# Patient Record
Sex: Male | Born: 1950 | Race: Black or African American | Hispanic: No | Marital: Single | Smoking: Never smoker
Health system: Southern US, Community
[De-identification: ages and names within clinical notes are randomized; demographics above are authoritative.]

## PROBLEM LIST (undated history)

## (undated) DIAGNOSIS — E7800 Pure hypercholesterolemia, unspecified: Secondary | ICD-10-CM

## (undated) DIAGNOSIS — G4730 Sleep apnea, unspecified: Secondary | ICD-10-CM

## (undated) DIAGNOSIS — E119 Type 2 diabetes mellitus without complications: Secondary | ICD-10-CM

## (undated) DIAGNOSIS — N400 Enlarged prostate without lower urinary tract symptoms: Secondary | ICD-10-CM

## (undated) HISTORY — PX: PROSTATECTOMY: SHX69

## (undated) HISTORY — PX: KIDNEY STONE SURGERY: SHX686

---

## 2001-04-04 ENCOUNTER — Encounter: Payer: Self-pay | Admitting: Orthopedic Surgery

## 2001-04-04 ENCOUNTER — Ambulatory Visit (HOSPITAL_COMMUNITY): Admission: RE | Admit: 2001-04-04 | Discharge: 2001-04-04 | Payer: Self-pay | Admitting: Orthopedic Surgery

## 2001-04-12 ENCOUNTER — Encounter (HOSPITAL_COMMUNITY): Admission: RE | Admit: 2001-04-12 | Discharge: 2001-05-12 | Payer: Self-pay | Admitting: Orthopedic Surgery

## 2001-04-25 ENCOUNTER — Ambulatory Visit (HOSPITAL_COMMUNITY): Admission: RE | Admit: 2001-04-25 | Discharge: 2001-04-25 | Payer: Self-pay | Admitting: Neurosurgery

## 2001-05-12 ENCOUNTER — Encounter (HOSPITAL_COMMUNITY): Admission: RE | Admit: 2001-05-12 | Discharge: 2001-06-11 | Payer: Self-pay | Admitting: Orthopedic Surgery

## 2002-11-22 ENCOUNTER — Emergency Department (HOSPITAL_COMMUNITY): Admission: EM | Admit: 2002-11-22 | Discharge: 2002-11-23 | Payer: Self-pay | Admitting: *Deleted

## 2003-03-26 ENCOUNTER — Emergency Department (HOSPITAL_COMMUNITY): Admission: EM | Admit: 2003-03-26 | Discharge: 2003-03-26 | Payer: Self-pay | Admitting: Emergency Medicine

## 2003-03-31 ENCOUNTER — Emergency Department (HOSPITAL_COMMUNITY): Admission: EM | Admit: 2003-03-31 | Discharge: 2003-03-31 | Payer: Self-pay | Admitting: Emergency Medicine

## 2004-07-16 ENCOUNTER — Emergency Department (HOSPITAL_COMMUNITY): Admission: EM | Admit: 2004-07-16 | Discharge: 2004-07-16 | Payer: Self-pay | Admitting: Emergency Medicine

## 2021-05-18 ENCOUNTER — Encounter (INDEPENDENT_AMBULATORY_CARE_PROVIDER_SITE_OTHER): Payer: Self-pay | Admitting: *Deleted

## 2021-06-08 NOTE — Progress Notes (Signed)
Nicholas Lawrence, [place] [address]   CLINIC:  Medical Oncology/Hematology  CONSULT NOTE  Patient Care Team: [facility] [place] as PCP - General Derek Jack, MD as Medical Oncologist (Medical Oncology) Brien Mates, RN as Oncology Nurse Navigator (Oncology)  CHIEF COMPLAINTS/PURPOSE OF CONSULTATION:  Evaluation of mantle cell lymphoma  HISTORY OF PRESENTING ILLNESS:  Nicholas Lawrence 70 y.o. male is here because of evaluation of mantle cell lymphoma.  Today he reports feeling good. He has a history of elevated WBC count starting in 2010. He denies night sweats, fevers, and unintentional weight loss although he reports intentionally losing about 10 lbs over the past 6 months. His appetite is good. He denies tingling/numbness in his hands and feet, cough, and fatigue. He denies history of MI and CVA.   He currently lives at home by himself, and he is able to do all of his typical home activities without assistance. His daughter is his closest relative and lives in [place]. 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 His brother was a smoker and had lung cancer, and his paternal uncle was also a smoker and had throat cancer.   MEDICAL HISTORY:  No past medical history on file.  SURGICAL HISTORY: Several prostate biopsies for elevated PSA, all negative for malignancy.  Simple prostatectomy 2006.  Percutaneous lithotripsy 1995.  SOCIAL HISTORY: Social History   Socioeconomic History   Marital status: Single    Spouse name: Not on file   Number of children: Not on file   Years of education: Not on file   Highest education level: Not on file  Occupational History   Not on file  Tobacco Use   Smoking status: Not on file   Smokeless tobacco: Not on file  Substance and Sexual Activity    Alcohol use: Not on file   Drug use: Not on file   Sexual activity: Not on file  Other Topics Concern   Not on file  Social History Narrative   Not on file   Social Determinants of Health   Financial Resource Strain: Not on file  Food Insecurity: Not on file  Transportation Needs: Not on file  Physical Activity: Not on file  Stress: Not on file  Social Connections: Not on file  Intimate Partner Violence: Not on file    FAMILY HISTORY: No family history on file.  ALLERGIES:  has no allergies on file.  MEDICATIONS:  Current Outpatient Medications  Medication Sig Dispense Refill   aspirin 81 MG EC tablet Take 1 tablet by mouth daily.     Cholecalciferol (VITAMIN D3) 1.25 MG (50000 UT) TABS Take 125 mcg by mouth daily at 2 am.     finasteride (PROSCAR) 5 MG tablet Take 5 mg by mouth daily.     glipiZIDE (GLUCOTROL) 5 MG tablet Take by mouth daily before breakfast.     lisinopril (ZESTRIL) 40 MG tablet Take 40 mg by mouth daily.     loratadine (CLARITIN) 10 MG tablet Take 10 mg by mouth daily.     sertraline (ZOLOFT) 100 MG tablet Take by mouth daily.     simvastatin (ZOCOR) 10 MG tablet Take 1 tablet by mouth at bedtime.     tamsulosin (FLOMAX) 0.4 MG CAPS capsule Take 0.4 mg by mouth daily.     No  current facility-administered medications for this visit.    REVIEW OF SYSTEMS:   Review of Systems  Constitutional:  Negative for appetite change, fatigue (75%), fever and unexpected weight change.  Respiratory:  Negative for cough.   Neurological:  Negative for numbness.  All other systems reviewed and are negative.   PHYSICAL EXAMINATION: ECOG PERFORMANCE STATUS: 1 - Symptomatic but completely ambulatory  Vitals:   06/09/21 0755  BP: 122/68  Pulse: 97  Resp: 19  Temp: (!) 96.9 F (36.1 C)  SpO2: 94%   Filed Weights   06/09/21 0755  Weight: 163 lb 4.8 oz (74.1 kg)   Physical Exam Vitals reviewed.  Constitutional:      Appearance: Normal appearance.   Cardiovascular:     Rate and Rhythm: Normal rate and regular rhythm.     Pulses: Normal pulses.     Heart sounds: Normal heart sounds.  Pulmonary:     Effort: Pulmonary effort is normal.     Breath sounds: Normal breath sounds.  Chest:     Chest wall: Deformity (Prominent lower part of sternum) present.  Abdominal:     Palpations: Abdomen is soft. There is splenomegaly (6 fingerbreadths below left costal margin). There is no hepatomegaly or mass.     Tenderness: There is no abdominal tenderness.  Musculoskeletal:     Right lower leg: No edema.     Left lower leg: No edema.  Lymphadenopathy:     Cervical: No cervical adenopathy.     Right cervical: No superficial cervical adenopathy.    Left cervical: No superficial cervical adenopathy.     Upper Body:     Right upper body: Axillary adenopathy (sub cm) present. No supraclavicular adenopathy.     Left upper body: Axillary adenopathy (sub cm) present. No supraclavicular adenopathy.  Neurological:     General: No focal deficit present.     Mental Status: He is alert and oriented to person, place, and time.  Psychiatric:        Mood and Affect: Mood normal.        Behavior: Behavior normal.     LABORATORY DATA:  I have reviewed the data as listed CBC Latest Ref Rng & Units 06/09/2021  WBC 4.0 - 10.5 K/uL 61.6(HH)  Hemoglobin 13.0 - 17.0 g/dL 10.8(L)  Hematocrit 39.0 - 52.0 % 35.9(L)  Platelets 150 - 400 K/uL 105(L)   CMP Latest Ref Rng & Units 06/09/2021  Glucose 70 - 99 mg/dL 117(H)  BUN 8 - 23 mg/dL 20  Creatinine 0.61 - 1.24 mg/dL 1.48(H)  Sodium 135 - 145 mmol/L 135  Potassium 3.5 - 5.1 mmol/L 4.3  Chloride 98 - 111 mmol/L 103  CO2 22 - 32 mmol/L 25  Calcium 8.9 - 10.3 mg/dL 9.3  Total Protein 6.5 - 8.1 g/dL 9.4(H)  Total Bilirubin 0.3 - 1.2 mg/dL 0.4  Alkaline Phos 38 - 126 U/L 80  AST 15 - 41 U/L 15  ALT 0 - 44 U/L 19    RADIOGRAPHIC STUDIES: I have personally reviewed the radiological images as listed and  agreed with the findings in the report. No results found.  ASSESSMENT:  Mantle cell lymphoma, stage IIIa: - 2010: Lymphocytosis - August 2019: Flow cytometry-clonal CD20 positive B cells that coexpress CD5 and surface kappa light chain.  Negative for CD10 and CD23 comprising 78% lymphocytes. - PET scan 08/12/2246: Hypermetabolic adenopathy in the right neck, axilla, mediastinum, porta hepatis and pelvis. - PET scan on 08/17/35: Hypermetabolic right level 2 and  level 3 adenopathy, SUV 6.5.  Hypermetabolic bilateral axillary adenopathy SUV 4.3.  Mediastinal lymph node SUV 5.0.  Ill-defined lesion of the right upper lobe SUV 1.3.  Porta hepatic lymph node SUV 7.4.  Enlarged spleen with hypermetabolic activity.  Pelvic lymph nodes SUV 7.0.  Increased uptake in the posterior right prostate SUV 6.4. - Right axillary lymph node needle biopsy on 01/19/2021 consistent with mantle cell lymphoma.  SOX11 variable expression. - He was evaluated by Dr. Corine Shelter at [facility] and was recommended chemoimmunotherapy as his hemoglobin and platelets were trending down. - He had voluntary weight loss of 10 pounds in the last 6 months.  Denies any fevers or night sweats.   Social/family history: - He lives by himself at home.  His daughter lives in [place]. - He served in the [employer] and participated in the operation [employer] in the [place].  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  He is a non-smoker. - Brother died of lung cancer and paternal uncle had throat cancer.   PLAN:  Stage IIIa mantle cell lymphoma: - We have reviewed previous PET scan and biopsy findings with the patient in detail.  We discussed the natural history of mantle cell lymphoma. - Peripheral blood cyclin D1 was negative.  Would obtain complete pathology report from [facility] to see if [place] for t(11;14) was done. - We will consider rebiopsy if previous studies inadequate. - We will order T p53 mutation testing which usually  portends a poor prognosis. - I will order another PET CT scan.  This is to serve as a baseline as well as rule out transformation prior to start of treatment for lymphoma. - We will repeat his CBC, LDH, CMP.  We will also do anemia panel.  We will also check tumor lysis labs. - He will need port placement. - If there is no transformation, agree with Bendamustine and rituximab based regimen which is more tolerable and has good response rates.   All questions were answered. The patient knows to call the clinic with any problems, questions or concerns.   Derek Jack, MD, 06/09/21 6:33 PM  [place] [phone]   I, Thana Ates, am acting as a scribe for Dr. Derek Jack.  I, Derek Jack MD, have reviewed the above documentation for accuracy and completeness, and I agree with the above.

## 2021-06-09 ENCOUNTER — Other Ambulatory Visit: Payer: Self-pay

## 2021-06-09 ENCOUNTER — Encounter (HOSPITAL_COMMUNITY): Payer: Self-pay

## 2021-06-09 ENCOUNTER — Inpatient Hospital Stay (HOSPITAL_COMMUNITY): Payer: No Typology Code available for payment source

## 2021-06-09 ENCOUNTER — Inpatient Hospital Stay (HOSPITAL_COMMUNITY): Payer: No Typology Code available for payment source | Attending: Hematology | Admitting: Hematology

## 2021-06-09 DIAGNOSIS — Z808 Family history of malignant neoplasm of other organs or systems: Secondary | ICD-10-CM | POA: Diagnosis not present

## 2021-06-09 DIAGNOSIS — C8318 Mantle cell lymphoma, lymph nodes of multiple sites: Secondary | ICD-10-CM

## 2021-06-09 DIAGNOSIS — Z87891 Personal history of nicotine dependence: Secondary | ICD-10-CM | POA: Diagnosis not present

## 2021-06-09 DIAGNOSIS — C8310 Mantle cell lymphoma, unspecified site: Secondary | ICD-10-CM | POA: Insufficient documentation

## 2021-06-09 DIAGNOSIS — Z801 Family history of malignant neoplasm of trachea, bronchus and lung: Secondary | ICD-10-CM | POA: Diagnosis not present

## 2021-06-09 LAB — IRON AND TIBC
Iron: 37 ug/dL — ABNORMAL LOW (ref 45–182)
Saturation Ratios: 13 % — ABNORMAL LOW (ref 17.9–39.5)
TIBC: 292 ug/dL (ref 250–450)
UIBC: 255 ug/dL

## 2021-06-09 LAB — HEPATITIS C ANTIBODY: HCV Ab: NONREACTIVE

## 2021-06-09 LAB — CBC WITH DIFFERENTIAL/PLATELET
Basophils Absolute: 0.6 10*3/uL — ABNORMAL HIGH (ref 0.0–0.1)
Basophils Relative: 1 %
Eosinophils Absolute: 2.5 10*3/uL — ABNORMAL HIGH (ref 0.0–0.5)
Eosinophils Relative: 4 %
HCT: 35.9 % — ABNORMAL LOW (ref 39.0–52.0)
Hemoglobin: 10.8 g/dL — ABNORMAL LOW (ref 13.0–17.0)
Lymphocytes Relative: 90 %
Lymphs Abs: 55.4 10*3/uL — ABNORMAL HIGH (ref 0.7–4.0)
MCH: 23.8 pg — ABNORMAL LOW (ref 26.0–34.0)
MCHC: 30.1 g/dL (ref 30.0–36.0)
MCV: 79.2 fL — ABNORMAL LOW (ref 80.0–100.0)
Monocytes Absolute: 0 10*3/uL — ABNORMAL LOW (ref 0.1–1.0)
Monocytes Relative: 0 %
Neutro Abs: 3.1 10*3/uL (ref 1.7–7.7)
Neutrophils Relative %: 5 %
Platelets: 105 10*3/uL — ABNORMAL LOW (ref 150–400)
RBC: 4.53 MIL/uL (ref 4.22–5.81)
RDW: 18.4 % — ABNORMAL HIGH (ref 11.5–15.5)
WBC Morphology: ABNORMAL
WBC: 61.6 10*3/uL (ref 4.0–10.5)
nRBC: 0 % (ref 0.0–0.2)

## 2021-06-09 LAB — HEPATITIS B SURFACE ANTIGEN: Hepatitis B Surface Ag: NONREACTIVE

## 2021-06-09 LAB — COMPREHENSIVE METABOLIC PANEL
ALT: 19 U/L (ref 0–44)
AST: 15 U/L (ref 15–41)
Albumin: 3.7 g/dL (ref 3.5–5.0)
Alkaline Phosphatase: 80 U/L (ref 38–126)
Anion gap: 7 (ref 5–15)
BUN: 20 mg/dL (ref 8–23)
CO2: 25 mmol/L (ref 22–32)
Calcium: 9.3 mg/dL (ref 8.9–10.3)
Chloride: 103 mmol/L (ref 98–111)
Creatinine, Ser: 1.48 mg/dL — ABNORMAL HIGH (ref 0.61–1.24)
GFR, Estimated: 50 mL/min — ABNORMAL LOW (ref >60)
Glucose, Bld: 117 mg/dL — ABNORMAL HIGH (ref 70–99)
Potassium: 4.3 mmol/L (ref 3.5–5.1)
Sodium: 135 mmol/L (ref 135–145)
Total Bilirubin: 0.4 mg/dL (ref 0.3–1.2)
Total Protein: 9.4 g/dL — ABNORMAL HIGH (ref 6.5–8.1)

## 2021-06-09 LAB — FERRITIN: Ferritin: 86 ng/mL (ref 24–336)

## 2021-06-09 LAB — LACTATE DEHYDROGENASE: LDH: 135 U/L (ref 98–192)

## 2021-06-09 LAB — HEPATITIS B SURFACE ANTIBODY,QUALITATIVE: Hep B S Ab: NONREACTIVE

## 2021-06-09 LAB — URIC ACID: Uric Acid, Serum: 8.5 mg/dL (ref 3.7–8.6)

## 2021-06-09 LAB — VITAMIN B12: Vitamin B-12: 345 pg/mL (ref 180–914)

## 2021-06-09 LAB — FOLATE: Folate: 8.9 ng/mL (ref >5.9)

## 2021-06-09 LAB — HEPATITIS B CORE ANTIBODY, TOTAL: Hep B Core Total Ab: NONREACTIVE

## 2021-06-09 NOTE — Progress Notes (Unsigned)
CRITICAL VALUE ALERT Critical value received:  WBC 61.6 Date of notification:  06/09/21 Time of notification: 0721 Critical value read back:  Yes.   Nurse who received alert:  C.Draken Farrior RN MD notified time and response:  1045 sent to MD who is seeing pt today

## 2021-06-09 NOTE — Progress Notes (Signed)
I met with the patient today during and following visit with Dr. Delton Coombes. I introduced myself and explained my role in the patient's care. I provided the patient with my contact information and encourage him and his family to call with questions or concerns. Patient provided with written information on Rituxan and Bendamustine as discussed by Dr. Delton Coombes. Patient scheduled for chemotherapy education.

## 2021-06-09 NOTE — Patient Instructions (Addendum)
[place] at [facility] Discharge Instructions  You were seen and examined today by Dr. Delton Coombes. Dr. Delton Coombes is a medical oncologist, meaning that he specializes in cancer diagnoses. Dr. Delton Coombes discussed your past medical history, family history of cancer, and the events that led to you being here today.  You have been diagnosed with Mantle Cell Lymphoma. This is a type of slow-growing lymphoma, it cannot be cured but it can be controlled. We know that it is slow growing because it has been present for years without complications, but now that your platelets and red blood cells are dropping, treatment is recommended. Dr. Delton Coombes discussed a combination of chemotherapy and immunotherapy.  Dr. Delton Coombes has recommended additional lab work today as well as a PET scan to establish baseline prior to starting treatment. Dr. Delton Coombes has also recommended a Port-A-Cath placement. A Port-A-Cath (port) is the safest way to administer chemotherapy.  Follow-up as scheduled.   Thank you for choosing [place] at [facility] to provide your oncology and hematology care.  To afford each patient quality time with our provider, please arrive at least 15 minutes before your scheduled appointment time.   If you have a lab appointment with the [facility] please come in thru the Main Entrance and check in at the main information desk.  You need to re-schedule your appointment should you arrive 10 or more minutes late.  We strive to give you quality time with our providers, and arriving late affects you and other patients whose appointments are after yours.  Also, if you no show three or more times for appointments you may be dismissed from the clinic at the providers discretion.     Again, thank you for choosing [facility].  Our hope is that these requests will decrease the amount of time that you wait before being seen by our physicians.        _____________________________________________________________  Should you have questions after your visit to [facility], please contact our office at [phone] and follow the prompts.  Our office hours are 8:00 a.m. and 4:30 p.m. Monday - Friday.  Please note that voicemails left after 4:00 p.m. may not be returned until the following business day.  We are closed weekends and major holidays.  You do have access to a nurse 24-7, just call the main number to the clinic [phone] and do not press any options, hold on the line and a nurse will answer the phone.    For prescription refill requests, have your pharmacy contact our office and allow 72 hours.    Due to Covid, you will need to wear a mask upon entering the hospital. If you do not have a mask, a mask will be given to you at the Main Entrance upon arrival. For doctor visits, patients may have 1 support person age 62 or older with them. For treatment visits, patients can not have anyone with them due to social distancing guidelines and our immunocompromised population.

## 2021-06-10 LAB — COPPER, SERUM: Copper: 196 ug/dL — ABNORMAL HIGH (ref 69–132)

## 2021-06-10 LAB — BETA 2 MICROGLOBULIN, SERUM: Beta-2 Microglobulin: 4.4 mg/L — ABNORMAL HIGH (ref 0.6–2.4)

## 2021-06-11 ENCOUNTER — Encounter (HOSPITAL_COMMUNITY): Admission: RE | Admit: 2021-06-11 | Payer: No Typology Code available for payment source | Source: Ambulatory Visit

## 2021-06-11 LAB — PATHOLOGIST SMEAR REVIEW

## 2021-06-13 NOTE — Patient Instructions (Addendum)
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  You will be treated in the clinic every 3 weeks with a combination of chemotherapy and immunotherapy drugs.  Those drugs are Rituxan (immunotherapy) and bendamustine (chemo).  You will come 2 days in a row every 3 weeks to receive treatment.  The first day you will receive both drugs.  On Day 2, you will receive only bendamustine. The intent of treatment is to control this cancer, prevent it from spreading further, and to alleviate any symptoms you may be having related to this disease.  You will see the doctor regularly throughout treatment.  We will obtain blood work from you prior to every treatment and monitor your results to make sure it is safe to give your treatment. The doctor monitors your response to treatment by the way you are feeling, your blood work, and by obtaining scans periodically.  There will be wait times while you are here for treatment.  It will take about 30 minutes to 1 hour for your lab work to result.  Then there will be wait times while pharmacy mixes your medications.     Medications you will receive in the clinic prior to your chemotherapy medications:  Aloxi:  ALOXI is used in adults to help prevent nausea and vomiting that happens with certain chemotherapy drugs.  Aloxi is a long acting medication, and will remain in your system for about two days.   Dexamethasone:  This is a steroid given prior to chemotherapy to help prevent allergic reactions; it may also help prevent and control nausea and diarrhea.   Tylenol:  Given to prevent infusion reactions to rituximab.  Benadryl:  Antihistamine given to prevent allergic/infusion reactions to rituximab.   Rasburicase:  Rasburicase is used to prevent and treat high uric acid levels due to cancer and cancer treatment. It is given in the vein (IV). It takes 30 minutes to infuse.       Rituximab (Generic Name) Other  Name: Rituxan  About This Drug  Rituximab is a monoclonal antibody used to treat cancer. This drug is given in the vein (IV).  This first time this is given it will be infused slower to monitor for infusion reactions. If you tolerate the first infusion well, going forward we give it at the normal rate, which takes half as long to infuse.    Possible Side Effects (More Common)   Bone marrow depression. This is a decrease in the number of white blood cells, red blood cells, and platelets. This  may raise your risk of infection, make you tired and weak (fatigue), and raise your risk of bleeding.   Rash-skin irritation, redness or itching (dermatitis)   Flu-like symptoms: fever, headache, muscle and joint aches, and fatigue (low energy, feeling weak)   Infusion-related reactions   Hepatitis B - if you have ever had hepatitis B, the virus may come back during treatment with this drug. Your doctor will test to see if you have ever had hepatitis B prior to your treatment.   Changes in your central nervous system can happen. The central nervous system is made up of your brain and spinal cord. You could feel: extreme tiredness, agitation, confusion, or have: hallucinations (see or hear things that are not there), trouble understanding or speaking, loss of control of your bowels or bladder, eyesight changes, numbness or lack of strength to your arms, legs, face, or body, seizures or coma. If you  start to have any of these symptoms let your doctor know right away.   Tumor lysis: This drug may act on the cancer cells very quickly. This may affect how your kidneys work. Your doctor will monitor your kidney function.   Changes in your liver function. Your doctor will check your liver function as needed.   Nausea and throwing up (vomiting): these symptoms may happen within a few hours after your treatment and may last up to 24 hours. Medicines are available to stop or lessen these side effects.   Loose  bowel movements (diarrhea) that may last for a few days   Abdominal pain   Infections   Cough, runny nose   Swelling of your legs, ankles and/or feet or hands   High blood pressure. Your doctor will check your blood pressure as needed.   Abnormal heart beat  Possible Side Effects (Less Common)   Shortness of breath   Soreness of the mouth and throat. You may have red areas, white patches, or sores that hurt.  Infusion Reactions  Infusion Reactions are the most common side effect linked to use of this drug and can be quite severe. Medicines will be given before you get the drug to lower the severity of this side effect. The infusion reactions are the worse with the first dose of the drug and become less severe with more doses of the drug. While you are getting this drug in your vein (IV), tell your nurse right away if you have any of these symptoms of an allergic reaction:   Trouble catching your breath   Feeling like your tongue or throat are swelling   Feeling your heart beat quickly or in a not normal way (palpitations)   Feeling dizzy or lightheaded   Flushing, itching, rash, and/or hives   Treating Side Effects   Ask your doctor or nurse about medicine to stop or lessen headache, loose bowel movements (diarrhea), constipation, nausea, throwing up (vomiting), or pain.   If you get a rash do not put anything it unless your doctor or nurse says you may. Keep the area around the rash clean and dry. Ask your doctor for medicine if the rash bothers you.   Drink 6-8 cups of fluids each day unless your doctor has told you to limit your fluid intake due to some other health problem. A cup is 8 ounces of fluid. If you throw up or have loose bowel movements, you should drink more fluids so that you do not become dehydrated (lack of water in the body from losing too much fluid).   If you are not able to move your bowels, check with your doctor or nurse before you use enemas,  laxatives, or suppositories   If you have mouth sores, avoid mouthwash that has alcohol. Also avoid alcohol and smoking because they can bother your mouth and throat.   If you have a nose bleed, sit with your head tipped slightly forward. Apply pressure by lightly pinching the bridge of your nose between your thumb and forefinger. Call your doctor if you feel dizzy or faint or if the bleeding doesn't stop after 10 to 15 minutes   Important Information   After treatment with this drug, vaccination with live viruses should be delayed until the immune system recovers.   Symptoms of abnormal bleeding may be: coughing up blood, throwing up blood (may look like coffee grounds), red or black, tarry bowel movements, blood in urine, abnormally heavy menstrual flow, nosebleeds,  or any unusual bleeding.   Symptoms of high blood pressure may be: headache, blurred vision, confusion, chest pain, or a feeling that your heart is beat differently.   Urinary tract infection. Symptoms may include:   Pain or burning when you pass urine   Feeling like you have to pass urine often, but not much comes out when you do.   Tender or heavy feeling in your lower abdomen   Cloudy urine and/or urine that smells bad.   Pain on one side of your back under your ribs. This is where your kidneys are.   Fever, chills, nausea and/or throwing up   Food and Drug Interactions  There are no known interactions of rituximab and any food. This drug may interact with other medicines. Tell your doctor and pharmacist about all the medicines and dietary supplements (vitamins, minerals, herbs and others) that you are taking at this time. The safety and use of dietary supplements and alternative diets are often not known. Using these might affect your cancer or interfere with your treatment. Until more is known, you should not use dietary supplements or alternative diets without your cancer doctor's help.   When to Call the  Doctor  Call your doctor or nurse right away if you have any of these symptoms:   Fever of 100.4 F (38 C) higher   Chills   Trouble breathing   Rash with or without itching   Blistering or peeling of skin   Chest pain or symptoms of a heart attack. Most heart attacks involve pain in the center of the chest that lasts more than a few minutes. The pain may go away and come back or it can be constant. It can feel like pressure, squeezing, fullness, or pain. Sometimes pain is felt in one or both arms, the back, neck, jaw, or stomach. If any of these symptoms last 2 minutes, call 911   Easy bleeding or bruising   Blood in urine or bowel movements   Feeling that your heart is beating in a fast or not normal way (palpitations)   Nausea that stops you from eating or drinking   Throwing up/vomiting   Abdominal pain   Loose bowel movements (diarrhea) 4 times in one day or diarrhea with weakness or lightheadedness   No bowel movement in 3 days or if you feel uncomfortable   Feeling dizzy or lightheaded   Changes in your speech or vision   Feeling confused   Weakness of your arms and legs or poor coordination (feeling clumsy)   Signs of liver problems: dark urine, pale bowel movements, bad stomach pain, feeling very tired and weak, unusual itching, or yellowing of skin or eyes   Symptoms of a urinary tract infection (see important information)   Call your doctor or nurse as soon as possible if you have any of these symptoms:   Swelling of your legs, ankles and/or feet   Fatigue and /or weakness that interferes with your daily activities   Joint and muscle pain or muscle spasms that are not relieved by prescribed medicines   Cough that lasts longer than normal   Reproduction Concerns   Pregnancy warning: This drug is known to cross the placenta. This drug may have harmful effects on an unborn baby. Effective methods of birth control should be used during treatment with  this drug and for 12 months after the last treatment. If exposure occurs to an unborn baby, the baby's immune system may be affected, which  could last for months after birth. Until the immune system recovers, live vaccines should not be administered to the baby. Be sure to talk with your doctor if you are pregnant or planning to become pregnant while getting this drug.   Breast feeding warning: It is not known if rituximab is passed into human breast milk. In animal studies, this drug was detected in in breast milk. For this reason, women should talk to their doctor about the risks and benefits of breast feeding during treatment with this drug because this drug may enter the breast milk and badly harm a breast feeding baby.     Bendamustine Donnie Aho, [place])   About This Drug Bendamustine is used to treat cancer. It is given in the vein (IV).  It takes 10 minutes to infuse.    Possible Side Effects  Bone marrow suppression. This is a decrease in the number of white blood cells, red blood cells, and platelets. This may raise your risk of infection, make you tired and weak (fatigue), and raise your risk of bleeding.    Soreness of the mouth and throat. You may have red areas, white patches, or sores that hurt.    Nausea and vomiting (throwing up)    Diarrhea (loose bowel movements)    Constipation (not able to move bowels)    Fever    Tiredness    Changes in your liver function    Decreased appetite (decreased hunger)    Weight loss    Headache    Cough, trouble breathing    Rash   Note: Each of the side effects above was reported in 15% or greater of patients treated with bendamustine. Not all possible side effects are included above.   Warnings and Precautions    Severe bone marrow suppression and infections, which may be life-threatening.    Allergic reactions, including anaphylaxis are rare but may happen in some patients. Signs of allergic reaction to this drug may be  swelling of the face, feeling like your tongue or throat are swelling, trouble breathing, rash, itching, fever, chills, feeling dizzy, and/or feeling that your heart is beating in a fast or not normal way. If this happens, do not take another dose of this drug. You should get urgent medical treatment.      While you are getting this drug in your vein (IV), you may have a reaction to the drug. Sometimes you may be given medication to stop or lessen these side effects. Your nurse will check you closely for these signs: fever or shaking chills, flushing, facial swelling, feeling dizzy, headache, trouble breathing, rash, itching, chest tightness, or chest pain. These reactions may happen after your infusion. If this happens, call 911 for emergency care    Skin and tissue irritation may involve redness, pain, warmth, or swelling at the IV site. This happens if the drug leaks out of the vein and into nearby tissue.    Tumor lysis syndrome: This drug may act on the cancer cells very quickly. This may affect how your kidneys work.    Severe allergic skin reaction, which may be life-threatening. You may develop blisters on your skin that are filled with fluid or a severe red rash all over your body that may be painful.    Severe changes in your liver function, which may be life-threatening.    This drug may raise your risk of getting a second cancer such as leukemia.   Note: Some of the side effects above are  very rare. If you have concerns and/or questions, please discuss them with your medical team.   Important Information  This drug may be present in the saliva, tears, sweat, urine, stool, vomit, semen, and vaginal secretions. Talk to your doctor and/or your nurse about the necessary precautions to take during this time.   Treating Side Effects  Manage tiredness by pacing your activities for the day.    Be sure to include periods of rest between energy-draining activities.    To decrease the risk  of infections, wash your hands regularly.    Avoid close contact with people who have a cold, the flu, or other infections.    Take your temperature as your doctor or nurse tells you, and whenever you feel like you may have a fever.    To help decrease the risk of bleeding, use a soft toothbrush. Check with your nurse before using dental floss.    Be very careful when using knives or tools.    Use an electric shaver instead of a razor.    Drink plenty of fluids (a minimum of eight glasses per day is recommended).    Mouth care is very important. Your mouth care should consist of routine, gentle cleaning of your teeth or dentures and rinsing your mouth with a mixture of 1/2 teaspoon of salt in 8 ounces of water or 1/2 teaspoon of baking soda in 8 ounces of water. This should be done at least after each meal and at bedtime.    If you have mouth sores, avoid mouthwash that has alcohol. Also avoid alcohol and smoking because they can bother your mouth and throat.    To help with nausea and vomiting, eat small, frequent meals instead of three large meals a day. Choose foods and drinks that are at room temperature. Ask your nurse or doctor about other helpful tips and medicine that is available to help stop or lessen these symptoms.    If you throw up or have loose bowel movements, you should drink more fluids so that you do not become dehydrated (lack of water in the body from losing too much fluid).    If you have diarrhea, eat low-fiber foods that are high in protein and calories and avoid foods that can irritate your digestive tracts or lead to cramping.    If you are not able to move your bowels, check with your doctor or nurse before you use enemas, laxatives, or suppositories.    Ask your doctor or nurse about medicines that are available to help stop or lessen constipation and/or diarrhea.    Infusion reactions may happen after your infusion. If this happens, call 911 for emergency  care.    To help with weight loss, drink fluids that contribute calories (whole milk, juice, soft drinks, sweetened beverages, milkshakes, and nutritional supplements) instead of water.    To help with decreased appetite, eat small, frequent meals. Eat foods high in calories and protein, such as meat, poultry, fish, dry beans, tofu, eggs, nuts, milk, yogurt, cheese, ice cream, pudding, and nutritional supplements.    Consider using sauces and spices to increase taste. Daily exercise, with your doctor's approval, may increase your appetite.    Keeping your pain under control is important to your well-being. Please tell your doctor or nurse if you are experiencing pain.    If you get a rash do not put anything on it unless your doctor or nurse says you may. Keep the area around the  rash clean and dry. Ask your doctor for medicine if your rash bothers you.   Food and Drug Interactions  There are no known interactions of bendamustine with food.    Check with your doctor or pharmacist about all other prescription medicines and over-the-counter medicines and dietary supplements (vitamins, minerals, herbs and others) you are taking before starting this medicine as there are known drug interactions with bendamustine. Also, check with your doctor or pharmacist before starting any new prescription or over-the-counter medicines, or dietary supplements to make sure that there are no interactions.   When to Call the Doctor Call your doctor or nurse if you have any of these symptoms and/or any new or unusual symptoms:    Fever of 100.4 F (38 C) or higher    Chills    Tiredness that interferes with your daily activities    Feeling dizzy or lightheaded    Coughing up yellow, green, or bloody mucus    Wheezing or trouble breathing    Headache that does not go away    Easy bleeding or bruising    No bowel movement in 3 days or when you feel uncomfortable.    Diarrhea, 4 times in one day or  diarrhea with lack of strength or a feeling of being dizzy    Pain in your mouth or throat that makes it hard to eat or drink    Nausea that stops you from eating or drinking and/or is not relieved by prescribed medicines    Throwing up    Lasting loss of appetite or rapid weight loss of five pounds in a week    Flu-like symptoms: fever, headache, muscle and joint aches, and fatigue (low energy, feeling weak)    A new rash or a rash that is not relieved by prescribed medicines    Signs of allergic reaction: swelling of the face, feeling like your tongue or throat are swelling, trouble breathing, rash, itching, fever, chills, feeling dizzy, and/or feeling that your heart is beating in a fast or not normal way. If this happens, call 911 for emergency care.    Signs of infusion reaction: fever or shaking chills, flushing, facial swelling, feeling dizzy, headache, trouble breathing, rash, itching, chest tightness, or chest pain. If this happens, call 911 for emergency care.    While you are getting this drug, please tell your nurse right away if you have any pain, redness, or swelling at the site of the IV infusion.    Signs of possible liver problems: dark urine, pale bowel movements, bad stomach pain, feeling very tired and weak, unusual itching, or yellowing of the eyes or skin    Signs of tumor lysis: confusion or agitation, decreased urine, nausea/vomiting, diarrhea, muscle cramping, numbness and/or tingling, seizures    If you think you may be pregnant, or may have impregnated your partner   Reproduction Warnings    Pregnancy warning: This drug can have harmful effects on the unborn baby. Women of childbearing potential should use effective methods of birth control during your cancer treatment and for at least 6 months after treatment. Men with male partners of childbearing potential should use effective methods of birth control during your cancer treatment and for at least 3 months  after your cancer treatment. Let your doctor know right away if you think you may be pregnant or may have impregnated your partner.    Breastfeeding warning: It is not known if this drug passes into breast milk. For this reason,  women should not breastfeed during treatment and for at least 1 week after treatment because this drug could enter the breast milk and cause harm to a breastfeeding baby.    Fertility warning: In men this drug may affect your ability to have children in the future. Talk with your doctor or nurse if you plan to have children. Ask for information on sperm banking.

## 2021-06-17 ENCOUNTER — Ambulatory Visit (HOSPITAL_COMMUNITY): Payer: Non-veteran care

## 2021-06-18 ENCOUNTER — Other Ambulatory Visit: Payer: Self-pay

## 2021-06-18 ENCOUNTER — Encounter (HOSPITAL_COMMUNITY)
Admission: RE | Admit: 2021-06-18 | Discharge: 2021-06-18 | Disposition: A | Discharge status: Home / Self Care | Payer: No Typology Code available for payment source | Source: Ambulatory Visit | Attending: Hematology | Admitting: Hematology

## 2021-06-18 DIAGNOSIS — C8318 Mantle cell lymphoma, lymph nodes of multiple sites: Secondary | ICD-10-CM | POA: Diagnosis not present

## 2021-06-18 MED ORDER — FLUDEOXYGLUCOSE F - 18 (FDG) INJECTION
8.2220 | Freq: Once | INTRAVENOUS | Status: AC | PRN
  Administered 2021-06-18: 8.222 via INTRAVENOUS

## 2021-06-19 ENCOUNTER — Other Ambulatory Visit: Payer: Self-pay | Admitting: Radiology

## 2021-06-20 LAB — MISC LABCORP TEST (SEND OUT): Labcorp test code: 489590

## 2021-06-22 ENCOUNTER — Encounter (HOSPITAL_COMMUNITY): Payer: Self-pay

## 2021-06-22 ENCOUNTER — Other Ambulatory Visit: Payer: Self-pay

## 2021-06-22 ENCOUNTER — Ambulatory Visit (HOSPITAL_COMMUNITY)
Admission: RE | Admit: 2021-06-22 | Discharge: 2021-06-22 | Disposition: A | Discharge status: Home / Self Care | Payer: No Typology Code available for payment source | Source: Ambulatory Visit | Attending: Hematology | Admitting: Hematology

## 2021-06-22 DIAGNOSIS — I10 Essential (primary) hypertension: Secondary | ICD-10-CM | POA: Diagnosis not present

## 2021-06-22 DIAGNOSIS — C8310 Mantle cell lymphoma, unspecified site: Secondary | ICD-10-CM | POA: Insufficient documentation

## 2021-06-22 DIAGNOSIS — E119 Type 2 diabetes mellitus without complications: Secondary | ICD-10-CM | POA: Diagnosis not present

## 2021-06-22 DIAGNOSIS — C8318 Mantle cell lymphoma, lymph nodes of multiple sites: Secondary | ICD-10-CM

## 2021-06-22 DIAGNOSIS — F4310 Post-traumatic stress disorder, unspecified: Secondary | ICD-10-CM | POA: Insufficient documentation

## 2021-06-22 HISTORY — PX: IR IMAGING GUIDED PORT INSERTION: IMG5740

## 2021-06-22 LAB — GLUCOSE, CAPILLARY: Glucose-Capillary: 82 mg/dL (ref 70–99)

## 2021-06-22 MED ORDER — SODIUM CHLORIDE 0.9 % IV SOLN: INTRAVENOUS | Status: DC

## 2021-06-22 MED ORDER — LIDOCAINE HCL 1 % IJ SOLN
INTRAMUSCULAR | Status: AC
  Administered 2021-06-22: 10 mL
  Filled 2021-06-22: qty 20

## 2021-06-22 MED ORDER — HEPARIN SOD (PORK) LOCK FLUSH 100 UNIT/ML IV SOLN
INTRAVENOUS | Status: AC
  Administered 2021-06-22: 500 [IU]
  Filled 2021-06-22: qty 5

## 2021-06-22 MED ORDER — LIDOCAINE-EPINEPHRINE (PF) 2 %-1:200000 IJ SOLN
INTRAMUSCULAR | Status: AC
  Administered 2021-06-22: 10 mL
  Filled 2021-06-22: qty 20

## 2021-06-22 NOTE — Progress Notes (Signed)
Upon patient arrival- pt. Did not have phone number for ride- went to look in waiting room (2 different occcaisions) for friend(Marco) but could not find. Contact PA- [facility] and informed that he may not have a ride home; if we could not find his friend or get phone number.   She will consult with Dr. Anselm Pancoast

## 2021-06-22 NOTE — Procedures (Signed)
Interventional Radiology Procedure:   Indications: Mantle cell lymphoma  Procedure: Port placement  Findings: Right jugular port, tip at SVC/RA junction  Complications: None     EBL: Minimal, less than 10 ml  Plan: Keep port site and incisions dry for at least 24 hours.     Fate Caster R. Anselm Pancoast, MD  Pager: [phone]

## 2021-06-22 NOTE — H&P (Signed)
Chief Complaint: Patient was seen in consultation today for port-a-catheter placement  Referring Physician(s): Katragadda,Sreedhar  Supervising Physician: Markus Daft  Patient Status: [facility] - Out-pt  History of Present Illness: Nicholas Lawrence is a 70 y.o. male with a medical history significant for HTN, PTSD, DM and Mantle Cell Lymphoma Stage IIIa. Lymphocytosis was first identified in 2010 and he is followed by Hematology/Oncology. PET scans in 2019 and 2022 revealed multiple hypermetabolic lymph nodes and he underwent right axillary lymph node needle biopsy 01/19/21 at the [place]. His oncology team is preparing him for chemo/immunotherapy and Interventional Radiology has been asked to evaluate this patient for an image-guided port-a-catheter placement.   No past medical history on file.  The histories are not reviewed yet. Please review them in the "History" navigator section and refresh this [place].  Allergies: Patient has no allergy information on record.  Medications: Prior to Admission medications   Medication Sig Start Date End Date Taking? Authorizing Provider  aspirin 81 MG EC tablet Take 1 tablet by mouth daily. 05/02/09   [provider]  BENDAMUSTINE HCL IV Inject into the vein every 21 ( twenty-one) days. Days 1 & 2 every 21 days 06/23/21   [provider]  Cholecalciferol (VITAMIN D3) 1.25 MG (50000 UT) TABS Take 125 mcg by mouth daily at 2 am.    [provider]  finasteride (PROSCAR) 5 MG tablet Take 5 mg by mouth daily.    [provider]  glipiZIDE (GLUCOTROL) 5 MG tablet Take by mouth daily before breakfast.    [provider]  lisinopril (ZESTRIL) 40 MG tablet Take 40 mg by mouth daily.    [provider]  loratadine (CLARITIN) 10 MG tablet Take 10 mg by mouth daily.    [provider]  riTUXimab (RITUXAN IV) Inject into the vein every 21 ( twenty-one) days. 06/23/21   [provider]   sertraline (ZOLOFT) 100 MG tablet Take by mouth daily.    [provider]  simvastatin (ZOCOR) 10 MG tablet Take 1 tablet by mouth at bedtime. 10/28/20 10/29/21  [provider]  tamsulosin (FLOMAX) 0.4 MG CAPS capsule Take 0.4 mg by mouth daily.    [provider]     No family history on file.  Social History   Socioeconomic History   Marital status: Single    Spouse name: Not on file   Number of children: Not on file   Years of education: Not on file   Highest education level: Not on file  Occupational History   Not on file  Tobacco Use   Smoking status: Not on file   Smokeless tobacco: Not on file  Substance and Sexual Activity   Alcohol use: Not on file   Drug use: Not on file   Sexual activity: Not on file  Other Topics Concern   Not on file  Social History Narrative   Not on file   Social Determinants of Health   Financial Resource Strain: Not on file  Food Insecurity: Not on file  Transportation Needs: Not on file  Physical Activity: Not on file  Stress: Not on file  Social Connections: Not on file    Review of Systems: A 12 point ROS discussed and pertinent positives are indicated in the HPI above.  All other systems are negative.  Review of Systems  Constitutional:  Negative for appetite change and fatigue.  Respiratory:  Negative for cough and shortness of breath.   Cardiovascular:  Negative for  chest pain and leg swelling.  Gastrointestinal:  Negative for abdominal pain, diarrhea, nausea and vomiting.  Neurological:  Negative for dizziness and headaches.   Vital Signs: BP 119/73   Pulse 88   Temp 98.5 F (36.9 C) (Oral)   Resp 18   Ht 5\' 6"  (1.676 m)   Wt 165 lb (74.8 kg)   SpO2 100%   BMI 26.63 kg/m   Physical Exam Constitutional:      General: He is not in acute distress.    Appearance: Normal appearance.  HENT:     Mouth/Throat:     Mouth: Mucous membranes are moist.     Pharynx: Oropharynx is clear.   Cardiovascular:     Rate and Rhythm: Normal rate and regular rhythm.     Pulses: Normal pulses.     Heart sounds: Normal heart sounds.  Pulmonary:     Effort: Pulmonary effort is normal.     Breath sounds: Normal breath sounds.  Abdominal:     General: Bowel sounds are normal.     Palpations: Abdomen is soft.     Tenderness: There is no abdominal tenderness.  Musculoskeletal:     Right lower leg: No edema.     Left lower leg: No edema.  Skin:    General: Skin is warm and dry.  Neurological:     Mental Status: He is alert and oriented to person, place, and time.    Imaging: NM PET Image Initial (PI) Skull Base To Thigh (F-18 FDG)  Result Date: 06/20/2021 CLINICAL DATA:  Initial treatment strategy for lymphoma. EXAM: NUCLEAR MEDICINE PET SKULL BASE TO THIGH TECHNIQUE: 8.22 mCi F-18 FDG was injected intravenously. Full-ring PET imaging was performed from the skull base to thigh after the radiotracer. CT data was obtained and used for attenuation correction and anatomic localization. Fasting blood glucose: 85 mg/dl COMPARISON:  None. FINDINGS: Mediastinal blood pool activity: SUV max she is 2.10 Liver activity: SUV max 3.14 NECK: Scattered hypermetabolic neck nodes. 8 mm node on the right side on image 45/3 has an SUV max of 4.90. 8 mm node on image 51/3 has an SUV max of 9.31. 8 mm supraclavicular node on the left on image 60/3 has an SUV max of 2.81. Incidental CT findings: none CHEST: Bilateral hypermetabolic axillary adenopathy. 12.5 mm right axillary node on image 84/3 has an SUV max of 4.16. Mediastinal and hilar hypermetabolic adenopathy. Right internal mammary node on image 91/3 measures 6 mm and SUV max is 3.34 Left hilar node has an SUV max of 3.43. No worrisome pulmonary lesions or hypermetabolic pulmonary nodules. Incidental CT findings: Scattered calcified mediastinal and hilar lymph nodes. Basilar pulmonary scarring. ABDOMEN/PELVIS: The spleen is enlarged measuring 22 x 14 x 10  cm. Is diffusely hypermetabolic with SUV max of 3.47. No focal lesions. Periportal hypermetabolic lymphadenopathy. 16 mm celiac axis node on image 126/3 has an SUV max of 11.22. 6.5 mm perisplenic node has an SUV max of 4.99 Right-sided retroperitoneal lymph node measures 12.5 mm on image 164/3 and has an SUV max of 4.96. Right inguinal node measures 14.5 mm and has an SUV max of 5.5. Focus of hypermetabolism in the right-side of the prostate gland has an SUV max of 6.60. This could be a focal area of inflammation but could not exclude prostate cancer. Prostate gland is markedly enlarged. Recommend correlation with PSA level. Patient may require prostate gland MRI and/or urology referral. Incidental CT findings: Moderate atherosclerotic calcifications involving the aorta and iliac  arteries. Right renal calculi with an 18 mm calculus in the right renal pelvis. There is also a 5 mm calculus in the right UPJ region with mild hydronephrosis. Diffuse colonic diverticulosis. SKELETON: No findings suspicious for osseous lymphoma. Incidental CT findings: none IMPRESSION: 1. Hypermetabolic lymphadenopathy involving the neck, chest, abdomen and inguinal region consistent with known lymphoma. Mainly [place] 5. 2. 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 3. Focus of hypermetabolism in right-side of the prostate gland suspicious for prostate cancer. Recommend correlation with PSA. 4. Right renal calculi with a 5 mm UPJ calculus causing mild right-sided hydronephrosis. Recommend urology consultation for this and for possible prostate cancer. Electronically Signed   By: Marijo Sanes M.D.   On: 06/20/2021 09:47    Labs:  CBC: Recent Labs    06/09/21 0853  WBC 61.6*  HGB 10.8*  HCT 35.9*  PLT 105*    COAGS: No results for input(s): INR, APTT in the last 8760 hours.  BMP: Recent Labs    06/09/21 0853  NA 135  K 4.3  CL 103  CO2 25  GLUCOSE 117*  BUN 20  CALCIUM 9.3   CREATININE 1.48*  GFRNONAA 50*    LIVER FUNCTION TESTS: Recent Labs    06/09/21 0853  BILITOT 0.4  AST 15  ALT 19  ALKPHOS 80  PROT 9.4*  ALBUMIN 3.7    TUMOR MARKERS: No results for input(s): AFPTM, CEA, CA199, CHROMGRNA in the last 8760 hours.  Assessment and Plan:  Lymphoma; chemo/immunotherapy: Nicholas Lawrence, 70 year old male, presents today to the [place] [facility] department for an image-guided port-a-catheter placement.   Risks and benefits of image-guided port-a-catheter placement were discussed with the patient including, but not limited to bleeding, infection, pneumothorax, or fibrin sheath development and need for additional procedures.  All of the patient's questions were answered, patient is agreeable to proceed. He has been NPO.   Consent signed and in chart.  Thank you for this interesting consult.  I greatly enjoyed meeting Brysin Towery and look forward to participating in their care.  A copy of this report was sent to the requesting provider on this date.  Electronically Signed: Soyla Dryer, AGACNP-BC [phone] 06/22/2021, 12:07 PM   I spent a total of  30 Minutes   in face to face in clinical consultation, greater than 50% of which was counseling/coordinating care for port-a-catheter placement

## 2021-06-22 NOTE — Discharge Instructions (Addendum)
For questions /concerns may call Interventional Radiology at [phone]  You may remove your dressing and shower tomorrow afternoon  DO NOT use EMLA cream for 2 weeks after port placement as the cream will remove surgical glue on your incision.   I   Implanted Port Insertion, Care After This sheet gives you information about how to care for yourself after your procedure. Your health care provider may also give you more specific instructions. If you have problems or questions, contact your health careprovider. What can I expect after the procedure? After the procedure, it is common to have: Discomfort at the port insertion site. Bruising on the skin over the port. This should improve over 3-4 days. Follow these instructions at home: [facility] care After your port is placed, you will get a manufacturer's information card. The card has information about your port. Keep this card with you at all times. Take care of the port as told by your health care provider. Ask your health care provider if you or a family member can get training for taking care of the port at home. A home health care nurse may also take care of the port. Make sure to remember what type of port you have. Incision care Follow instructions from your health care provider about how to take care of your port insertion site. Make sure you: Wash your hands with soap and water before and after you change your bandage (dressing). If soap and water are not available, use hand sanitizer. Change your dressing as told by your health care provider. Leave skin glue, or adhesive strips in place. These skin closures may need to stay in place for 2 weeks or longer.  Check your port insertion site every day for signs of infection. Check for:      - Redness, swelling, or pain.                     - Fluid or blood.      - Warmth.      - Pus or a bad smell. Activity Return to your normal activities as told by your health care provider. Ask your  health care provider what activities are safe for you. Do not lift anything that is heavier than 10 lb (4.5 kg), or the limit that you are told, until your health care provider says that it is safe. General instructions Take over-the-counter and prescription medicines only as told by your health care provider. Do not take baths, swim, or use a hot tub until your health care provider approves. Ask your health care provider if you may take showers. You may only be allowed to take sponge baths. Do not drive for 24 hours if you were given a sedative during your procedure. Wear a medical alert bracelet in case of an emergency. This will tell any health care providers that you have a port. Keep all follow-up visits as told by your health care provider. This is important. Contact a health care provider if: You cannot flush your port with saline as directed, or you cannot draw blood from the port. You have a fever or chills. You have redness, swelling, or pain around your port insertion site. You have fluid or blood coming from your port insertion site. Your port insertion site feels warm to the touch. You have pus or a bad smell coming from the port insertion site. Get help right away if: You have chest pain or shortness of breath. You have bleeding from  your port that you cannot control. Summary Take care of the port as told by your health care provider. Keep the manufacturer's information card with you at all times. Change your dressing as told by your health care provider. Contact a health care provider if you have a fever or chills or if you have redness, swelling, or pain around your port insertion site. Keep all follow-up visits as told by your health care provider. This information is not intended to replace advice given to you by your health care provider. Make sure you discuss any questions you have with your healthcare provider.

## 2021-06-22 NOTE — Progress Notes (Signed)
[place], [place] [address]   CLINIC:  Medical Oncology/Hematology  PCP:  [facility] / [place] [address] [phone]   REASON FOR VISIT:  Follow-up for mantle cell lymphoma  PRIOR THERAPY: none  NGS Results: not done  CURRENT THERAPY: under work-up  BRIEF ONCOLOGIC HISTORY:  Oncology History   No history exists.    CANCER STAGING:  Cancer Staging  Mantle cell lymphoma ([place]) Staging form: Hodgkin and Non-Hodgkin Lymphoma, AJCC 8th Edition - Clinical stage from 06/09/2021: Stage III - Unsigned   INTERVAL HISTORY:  [name], a 70 y.o. male, returns for routine follow-up of his mantle cell lymphoma. Nihaal was last seen on 06/09/2021.   Today he reports feeling good. His weight is stable.   REVIEW OF SYSTEMS:  Review of Systems  Constitutional:  Negative for appetite change, fatigue (75%) and unexpected weight change.  Cardiovascular:  Positive for chest pain (@ Port site).  All other systems reviewed and are negative.  PAST MEDICAL/SURGICAL HISTORY:  No past medical history on file.   SOCIAL HISTORY:  Social History   Socioeconomic History   Marital status: Single    Spouse name: Not on file   Number of children: Not on file   Years of education: Not on file   Highest education level: Not on file  Occupational History   Not on file  Tobacco Use   Smoking status: Not on file   Smokeless tobacco: Not on file  Substance and Sexual Activity   Alcohol use: Not on file   Drug use: Not on file   Sexual activity: Not on file  Other Topics Concern   Not on file  Social History Narrative   Not on file   Social Determinants of Health   Financial Resource Strain: Not on file  Food Insecurity: Not on file  Transportation Needs: Not on file  Physical Activity: Not on file  Stress: Not on file  Social Connections: Not on file  Intimate Partner Violence: Not on file    FAMILY  HISTORY:  No family history on file.  CURRENT MEDICATIONS:  Current Outpatient Medications  Medication Sig Dispense Refill   allopurinol (ZYLOPRIM) 300 MG tablet Take 1 tablet (300 mg total) by mouth daily. 30 tablet 2   aspirin 81 MG EC tablet Take 1 tablet by mouth daily.     BENDAMUSTINE HCL IV Inject into the vein every 21 ( twenty-one) days. Days 1 & 2 every 21 days     Cholecalciferol (VITAMIN D3) 1.25 MG (50000 UT) TABS Take 125 mcg by mouth daily at 2 am.     finasteride (PROSCAR) 5 MG tablet Take 5 mg by mouth daily.     glipiZIDE (GLUCOTROL) 5 MG tablet Take by mouth daily before breakfast.     lisinopril (ZESTRIL) 40 MG tablet Take 40 mg by mouth daily.     loratadine (CLARITIN) 10 MG tablet Take 10 mg by mouth daily.     riTUXimab (RITUXAN IV) Inject into the vein every 21 ( twenty-one) days.     sertraline (ZOLOFT) 100 MG tablet Take by mouth daily.     simvastatin (ZOCOR) 10 MG tablet Take 1 tablet by mouth at bedtime.     tamsulosin (FLOMAX) 0.4 MG CAPS capsule Take 0.4 mg by mouth daily.     No current facility-administered medications for this visit.    ALLERGIES:  Not on File  PHYSICAL EXAM:  Performance status (ECOG): 1 - Symptomatic but completely ambulatory  Vitals:   06/23/21 0831  BP: 122/60  Pulse: 74  Resp: 18  Temp: 97.9 F (36.6 C)  SpO2: 99%   Wt Readings from Last 3 Encounters:  06/23/21 146 lb 3.2 oz (66.3 kg)  06/22/21 165 lb (74.8 kg)  06/09/21 163 lb 4.8 oz (74.1 kg)   Physical Exam Vitals reviewed.  Constitutional:      Appearance: Normal appearance.  Cardiovascular:     Rate and Rhythm: Normal rate and regular rhythm.     Pulses: Normal pulses.     Heart sounds: Normal heart sounds.  Pulmonary:     Effort: Pulmonary effort is normal.     Breath sounds: Normal breath sounds.  Neurological:     General: No focal deficit present.     Mental Status: He is alert and oriented to person, place, and time.  Psychiatric:        Mood  and Affect: Mood normal.        Behavior: Behavior normal.     LABORATORY DATA:  I have reviewed the labs as listed.  CBC Latest Ref Rng & Units 06/23/2021 06/09/2021  WBC 4.0 - 10.5 K/uL 66.6(HH) 61.6(HH)  Hemoglobin 13.0 - 17.0 g/dL 10.0(L) 10.8(L)  Hematocrit 39.0 - 52.0 % 34.1(L) 35.9(L)  Platelets 150 - 400 K/uL 98(L) 105(L)   CMP Latest Ref Rng & Units 06/23/2021 06/09/2021  Glucose 70 - 99 mg/dL 102(H) 117(H)  BUN 8 - 23 mg/dL 29(H) 20  Creatinine 0.61 - 1.24 mg/dL 1.65(H) 1.48(H)  Sodium 135 - 145 mmol/L 134(L) 135  Potassium 3.5 - 5.1 mmol/L 4.2 4.3  Chloride 98 - 111 mmol/L 105 103  CO2 22 - 32 mmol/L 22 25  Calcium 8.9 - 10.3 mg/dL 9.0 9.3  Total Protein 6.5 - 8.1 g/dL 9.0(H) 9.4(H)  Total Bilirubin 0.3 - 1.2 mg/dL 0.6 0.4  Alkaline Phos 38 - 126 U/L 67 80  AST 15 - 41 U/L 16 15  ALT 0 - 44 U/L 13 19    DIAGNOSTIC IMAGING:  I have independently reviewed the scans and discussed with the patient. NM PET Image Initial (PI) Skull Base To Thigh (F-18 FDG)  Result Date: 06/20/2021 CLINICAL DATA:  Initial treatment strategy for lymphoma. EXAM: NUCLEAR MEDICINE PET SKULL BASE TO THIGH TECHNIQUE: 8.22 mCi F-18 FDG was injected intravenously. Full-ring PET imaging was performed from the skull base to thigh after the radiotracer. CT data was obtained and used for attenuation correction and anatomic localization. Fasting blood glucose: 85 mg/dl COMPARISON:  None. FINDINGS: Mediastinal blood pool activity: SUV max she is 2.10 Liver activity: SUV max 3.14 NECK: Scattered hypermetabolic neck nodes. 8 mm node on the right side on image 45/3 has an SUV max of 4.90. 8 mm node on image 51/3 has an SUV max of 9.31. 8 mm supraclavicular node on the left on image 60/3 has an SUV max of 2.81. Incidental CT findings: none CHEST: Bilateral hypermetabolic axillary adenopathy. 12.5 mm right axillary node on image 84/3 has an SUV max of 4.16. Mediastinal and hilar hypermetabolic adenopathy. Right  internal mammary node on image 91/3 measures 6 mm and SUV max is 3.34 Left hilar node has an SUV max of 3.43. No worrisome pulmonary lesions or hypermetabolic pulmonary nodules. Incidental CT findings: Scattered calcified mediastinal and hilar lymph nodes. Basilar pulmonary scarring. ABDOMEN/PELVIS: The spleen is enlarged measuring 22 x 14 x 10 cm. Is diffusely hypermetabolic with SUV max of  4.68. No focal lesions. Periportal hypermetabolic lymphadenopathy. 16 mm celiac axis node on image 126/3 has an SUV max of 11.22. 6.5 mm perisplenic node has an SUV max of 4.99 Right-sided retroperitoneal lymph node measures 12.5 mm on image 164/3 and has an SUV max of 4.96. Right inguinal node measures 14.5 mm and has an SUV max of 5.5. Focus of hypermetabolism in the right-side of the prostate gland has an SUV max of 6.60. This could be a focal area of inflammation but could not exclude prostate cancer. Prostate gland is markedly enlarged. Recommend correlation with PSA level. Patient may require prostate gland MRI and/or urology referral. Incidental CT findings: Moderate atherosclerotic calcifications involving the aorta and iliac arteries. Right renal calculi with an 18 mm calculus in the right renal pelvis. There is also a 5 mm calculus in the right UPJ region with mild hydronephrosis. Diffuse colonic diverticulosis. SKELETON: No findings suspicious for osseous lymphoma. Incidental CT findings: none IMPRESSION: 1. Hypermetabolic lymphadenopathy involving the neck, chest, abdomen and inguinal region consistent with known lymphoma. Mainly [place] 5. 2. 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 3. Focus of hypermetabolism in right-side of the prostate gland suspicious for prostate cancer. Recommend correlation with PSA. 4. Right renal calculi with a 5 mm UPJ calculus causing mild right-sided hydronephrosis. Recommend urology consultation for this and for possible prostate cancer.  Electronically Signed   By: Marijo Sanes M.D.   On: 06/20/2021 09:47   IR IMAGING GUIDED PORT INSERTION  Result Date: 06/22/2021 INDICATION: 70 year old with mantle cell lymphoma.  Port needed for therapy. EXAM: FLUOROSCOPIC AND ULTRASOUND GUIDED PLACEMENT OF A SUBCUTANEOUS PORT COMPARISON:  None. MEDICATIONS: Local anesthetic ANESTHESIA/SEDATION: Procedure was performed without moderate sedation per patient request. FLUOROSCOPY TIME:  18 seconds, 2 mGy COMPLICATIONS: None immediate. PROCEDURE: The procedure, risks, benefits, and alternatives were explained to the patient. Questions regarding the procedure were encouraged and answered. The patient understands and consents to the procedure. Patient was placed supine on the interventional table. Ultrasound confirmed a patent right internal jugular vein. Ultrasound image was saved for documentation. The right chest and neck were cleaned with a skin antiseptic and a sterile drape was placed. Maximal barrier sterile technique was utilized including caps, mask, sterile gowns, sterile gloves, sterile drape, hand hygiene and skin antiseptic. The right neck was anesthetized with 1% lidocaine. Small incision was made in the right neck with a blade. Micropuncture set was placed in the right internal jugular vein with ultrasound guidance. The micropuncture wire was used for measurement purposes. The right chest was anesthetized with 1% lidocaine with epinephrine. #15 blade was used to make an incision and a subcutaneous port pocket was formed. [place] was assembled. Subcutaneous tunnel was formed with a stiff tunneling device. The port catheter was brought through the subcutaneous tunnel. The port was placed in the subcutaneous pocket. The micropuncture set was exchanged for a peel-away sheath. The catheter was placed through the peel-away sheath and the tip was positioned at the superior cavoatrial junction. Catheter placement was confirmed with fluoroscopy.  The port was accessed and flushed with heparinized saline. The port pocket was closed using two layers of absorbable sutures and Dermabond. The vein skin site was closed using a single layer of absorbable suture and Dermabond. Sterile dressings were applied. Patient tolerated the procedure well without an immediate complication. Ultrasound and fluoroscopic images were taken and saved for this procedure. IMPRESSION: Placement of a subcutaneous power-injectable port device. Catheter tip at the superior cavoatrial  junction. Electronically Signed   By: Markus Daft M.D.   On: 06/22/2021 14:48     ASSESSMENT:  Mantle cell lymphoma, stage IIIa: - 2010: Lymphocytosis - August 2019: Flow cytometry-clonal CD20 positive B cells that coexpress CD5 and surface kappa light chain.  Negative for CD10 and CD23 comprising 78% lymphocytes. - PET scan 12/11/8313: Hypermetabolic adenopathy in the right neck, axilla, mediastinum, porta hepatis and pelvis. - PET scan on 07/18/6158: Hypermetabolic right level 2 and level 3 adenopathy, SUV 6.5.  Hypermetabolic bilateral axillary adenopathy SUV 4.3.  Mediastinal lymph node SUV 5.0.  Ill-defined lesion of the right upper lobe SUV 1.3.  Porta hepatic lymph node SUV 7.4.  Enlarged spleen with hypermetabolic activity.  Pelvic lymph nodes SUV 7.0.  Increased uptake in the posterior right prostate SUV 6.4. - Right axillary lymph node needle biopsy on 01/19/2021 consistent with mantle cell lymphoma.  SOX11 variable expression. - He was evaluated by Dr. Corine Shelter at [facility] and was recommended chemoimmunotherapy as his hemoglobin and platelets were trending down. - He had voluntary weight loss of 10 pounds in the last 6 months.  Denies any fevers or night sweats.    Social/family history: - He lives by himself at home.  His daughter lives in [place]. - He served in the [employer] and participated in the operation [place] in the [place].  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  He is a non-smoker. - Brother died of lung cancer and paternal uncle had throat cancer.   PLAN:  Stage IIIa mantle cell lymphoma, T p53 negative: - We reviewed PET scan from 06/18/2021. - Scattered hypermetabolic lymph nodes on the right side of the neck with SUV 9.3.  Bilateral axillary adenopathy, 12.5 mm right axillary lymph node with SUV 4.1.  Mediastinal and hilar lymph nodes SUV 3.3.  Spleen is enlarged with SUV 4.6.  Periportal lymph nodes with 84m celiac lymph node with SUV 11.2.  Right retroperitoneal lymph node with SUV 4.9.  Inguinal lymph node on the right side with SUV 5.5. - Peripheral blood cyclin D1 was negative.  [facility] - If not done, will consider repeating biopsy of the right axillary lymph node to confirm diagnosis. - We reviewed T p53 mutation results which were negative. - If no transformation, will consider initiating Bendamustine and rituximab.  2.  Microcytic anemia: - This is from CKD and relative iron deficiency. His ferritin is 86 and percent saturation 13.  BV37 copper and folic acid were normal. - We will strongly consider parenteral iron therapy.  3.  Hypermetabolism of the right prostate gland: - This was incidental finding on PET scan.  Will check PSA level.     Orders placed this encounter:  Orders Placed This Encounter  Procedures   PSA   Phosphorus     SDerek Jack MD [id number]  I, KThana Ates am acting as a scribe for Dr. SDerek Jack  I, SDerek JackMD, have reviewed the above documentation for accuracy and completeness, and I agree with the above.

## 2021-06-23 ENCOUNTER — Inpatient Hospital Stay (HOSPITAL_BASED_OUTPATIENT_CLINIC_OR_DEPARTMENT_OTHER): Payer: No Typology Code available for payment source | Admitting: Hematology

## 2021-06-23 ENCOUNTER — Inpatient Hospital Stay (HOSPITAL_COMMUNITY): Payer: No Typology Code available for payment source | Attending: Hematology

## 2021-06-23 ENCOUNTER — Ambulatory Visit (HOSPITAL_COMMUNITY): Payer: Non-veteran care

## 2021-06-23 VITALS — BP 122/60 | HR 74 | Temp 97.9°F | Resp 18 | Wt 163.6 lb

## 2021-06-23 DIAGNOSIS — C8318 Mantle cell lymphoma, lymph nodes of multiple sites: Secondary | ICD-10-CM

## 2021-06-23 DIAGNOSIS — C8310 Mantle cell lymphoma, unspecified site: Secondary | ICD-10-CM | POA: Diagnosis present

## 2021-06-23 DIAGNOSIS — R590 Localized enlarged lymph nodes: Secondary | ICD-10-CM | POA: Insufficient documentation

## 2021-06-23 DIAGNOSIS — Z125 Encounter for screening for malignant neoplasm of prostate: Secondary | ICD-10-CM

## 2021-06-23 DIAGNOSIS — D509 Iron deficiency anemia, unspecified: Secondary | ICD-10-CM | POA: Diagnosis not present

## 2021-06-23 DIAGNOSIS — R161 Splenomegaly, not elsewhere classified: Secondary | ICD-10-CM | POA: Diagnosis not present

## 2021-06-23 LAB — COMPREHENSIVE METABOLIC PANEL
ALT: 13 U/L (ref 0–44)
AST: 16 U/L (ref 15–41)
Albumin: 3.5 g/dL (ref 3.5–5.0)
Alkaline Phosphatase: 67 U/L (ref 38–126)
Anion gap: 7 (ref 5–15)
BUN: 29 mg/dL — ABNORMAL HIGH (ref 8–23)
CO2: 22 mmol/L (ref 22–32)
Calcium: 9 mg/dL (ref 8.9–10.3)
Chloride: 105 mmol/L (ref 98–111)
Creatinine, Ser: 1.65 mg/dL — ABNORMAL HIGH (ref 0.61–1.24)
GFR, Estimated: 44 mL/min — ABNORMAL LOW (ref >60)
Glucose, Bld: 102 mg/dL — ABNORMAL HIGH (ref 70–99)
Potassium: 4.2 mmol/L (ref 3.5–5.1)
Sodium: 134 mmol/L — ABNORMAL LOW (ref 135–145)
Total Bilirubin: 0.6 mg/dL (ref 0.3–1.2)
Total Protein: 9 g/dL — ABNORMAL HIGH (ref 6.5–8.1)

## 2021-06-23 LAB — CBC WITH DIFFERENTIAL/PLATELET
Basophils Absolute: 0 10*3/uL (ref 0.0–0.1)
Basophils Relative: 0 %
Eosinophils Absolute: 0 10*3/uL (ref 0.0–0.5)
Eosinophils Relative: 0 %
HCT: 34.1 % — ABNORMAL LOW (ref 39.0–52.0)
Hemoglobin: 10 g/dL — ABNORMAL LOW (ref 13.0–17.0)
Lymphocytes Relative: 88 %
Lymphs Abs: 58.6 10*3/uL — ABNORMAL HIGH (ref 0.7–4.0)
MCH: 23.3 pg — ABNORMAL LOW (ref 26.0–34.0)
MCHC: 29.3 g/dL — ABNORMAL LOW (ref 30.0–36.0)
MCV: 79.5 fL — ABNORMAL LOW (ref 80.0–100.0)
Monocytes Absolute: 4.7 10*3/uL — ABNORMAL HIGH (ref 0.1–1.0)
Monocytes Relative: 7 %
Neutro Abs: 3.3 10*3/uL (ref 1.7–7.7)
Neutrophils Relative %: 5 %
Platelets: 98 10*3/uL — ABNORMAL LOW (ref 150–400)
RBC: 4.29 MIL/uL (ref 4.22–5.81)
RDW: 19 % — ABNORMAL HIGH (ref 11.5–15.5)
WBC: 66.6 10*3/uL (ref 4.0–10.5)
nRBC: 0 % (ref 0.0–0.2)

## 2021-06-23 LAB — LACTATE DEHYDROGENASE: LDH: 135 U/L (ref 98–192)

## 2021-06-23 LAB — MAGNESIUM: Magnesium: 2.1 mg/dL (ref 1.7–2.4)

## 2021-06-23 LAB — URIC ACID: Uric Acid, Serum: 9.5 mg/dL — ABNORMAL HIGH (ref 3.7–8.6)

## 2021-06-23 LAB — PSA: Prostatic Specific Antigen: 4.47 ng/mL — ABNORMAL HIGH (ref 0.00–4.00)

## 2021-06-23 LAB — PHOSPHORUS: Phosphorus: 3 mg/dL (ref 2.5–4.6)

## 2021-06-23 MED ORDER — SODIUM CHLORIDE 0.9% FLUSH
10.0000 mL | Freq: Once | INTRAVENOUS | Status: AC
  Administered 2021-06-23: 10 mL via INTRAVENOUS

## 2021-06-23 MED ORDER — HEPARIN SOD (PORK) LOCK FLUSH 100 UNIT/ML IV SOLN
500.0000 [IU] | Freq: Once | INTRAVENOUS | Status: AC
  Administered 2021-06-23: 500 [IU] via INTRAVENOUS

## 2021-06-23 MED ORDER — ALLOPURINOL 300 MG PO TABS: 300.0000 mg | ORAL_TABLET | Freq: Every day | ORAL | 2 refills | Status: DC

## 2021-06-23 NOTE — Progress Notes (Signed)
CRITICAL VALUE ALERT Critical value received:  WBC 66.6 Date of notification:  06-23-21 Time of notification: 1000 Critical value read back:  Yes.   Nurse who received alert:  C. Emmanuela Ghazi RN  MD notified time and response:  Dr. Raliegh Ip, no action

## 2021-06-23 NOTE — Patient Instructions (Addendum)
[place] at [facility] Discharge Instructions   You were seen and examined today by Dr. Delton Coombes.  He reviewed your PET scan results with you.  We have not yet received the special test from the [place] to determine if the lymphoma you have is mantle cell lymphoma vs another type of lymphoma. Once we have the results from the [place], we can determine what your treatment will be.  If we do not receive those results from the [place], we can arrange for you to have a biopsy here locally.   Pick up allopurinol from [facility] in town and start taking today or tomorrow.  This is for your elevated uric acid.   Return as scheduled      Thank you for choosing [place] at [facility] to provide your oncology and hematology care.  To afford each patient quality time with our provider, please arrive at least 15 minutes before your scheduled appointment time.   If you have a lab appointment with the [facility] please come in thru the Main Entrance and check in at the main information desk.  You need to re-schedule your appointment should you arrive 10 or more minutes late.  We strive to give you quality time with our providers, and arriving late affects you and other patients whose appointments are after yours.  Also, if you no show three or more times for appointments you may be dismissed from the clinic at the providers discretion.     Again, thank you for choosing [facility].  Our hope is that these requests will decrease the amount of time that you wait before being seen by our physicians.       _____________________________________________________________  Should you have questions after your visit to [facility], please contact our office at [phone] and follow the prompts.  Our office hours are 8:00 a.m. and 4:30 p.m. Monday - Friday.  Please note that voicemails left after 4:00 p.m. may not be returned until the  following business day.  We are closed weekends and major holidays.  You do have access to a nurse 24-7, just call the main number to the clinic [phone] and do not press any options, hold on the line and a nurse will answer the phone.    For prescription refill requests, have your pharmacy contact our office and allow 72 hours.    Due to Covid, you will need to wear a mask upon entering the hospital. If you do not have a mask, a mask will be given to you at the Main Entrance upon arrival. For doctor visits, patients may have 1 support person age 82 or older with them. For treatment visits, patients can not have anyone with them due to social distancing guidelines and our immunocompromised population.

## 2021-06-24 ENCOUNTER — Ambulatory Visit (HOSPITAL_COMMUNITY): Payer: Non-veteran care

## 2021-07-02 ENCOUNTER — Other Ambulatory Visit (HOSPITAL_COMMUNITY): Payer: Non-veteran care

## 2021-07-02 ENCOUNTER — Ambulatory Visit (HOSPITAL_COMMUNITY): Payer: Non-veteran care | Admitting: Hematology

## 2021-07-09 ENCOUNTER — Ambulatory Visit (INDEPENDENT_AMBULATORY_CARE_PROVIDER_SITE_OTHER): Payer: No Typology Code available for payment source | Admitting: General Surgery

## 2021-07-09 ENCOUNTER — Encounter: Payer: Self-pay | Admitting: General Surgery

## 2021-07-09 ENCOUNTER — Other Ambulatory Visit: Payer: Self-pay

## 2021-07-09 VITALS — BP 112/62 | HR 92 | Temp 99.8°F | Resp 16 | Ht 66.0 in | Wt 166.0 lb

## 2021-07-09 DIAGNOSIS — C8318 Mantle cell lymphoma, lymph nodes of multiple sites: Secondary | ICD-10-CM | POA: Diagnosis not present

## 2021-07-10 ENCOUNTER — Telehealth (HOSPITAL_COMMUNITY): Payer: Self-pay

## 2021-07-10 NOTE — H&P (Signed)
Nicholas Lawrence; [id number]; 08-May-1951   HPI Patient is a 70 year old black male who was referred to my care by Dr. Delton Coombes of oncology for a lymph node biopsy.  Patient has a history of mantle cell lymphoma but needs more tissue for further diagnosis and treatment plan.  He has generalized lymphadenopathy. History reviewed. No pertinent past medical history.  Past Surgical History:  Procedure Laterality Date   IR IMAGING GUIDED PORT INSERTION  06/22/2021   KIDNEY STONE SURGERY     PROSTATECTOMY      Family History  Family history unknown: Yes    Current Outpatient Medications on File Prior to Visit  Medication Sig Dispense Refill   allopurinol (ZYLOPRIM) 300 MG tablet Take 1 tablet (300 mg total) by mouth daily. 30 tablet 2   aspirin 81 MG EC tablet Take 1 tablet by mouth daily.     BENDAMUSTINE HCL IV Inject into the vein every 21 ( twenty-one) days. Days 1 & 2 every 21 days     Cholecalciferol (VITAMIN D3) 1.25 MG (50000 UT) TABS Take 125 mcg by mouth daily at 2 am.     finasteride (PROSCAR) 5 MG tablet Take 5 mg by mouth daily.     glipiZIDE (GLUCOTROL) 5 MG tablet Take by mouth daily before breakfast.     lisinopril (ZESTRIL) 40 MG tablet Take 40 mg by mouth daily.     loratadine (CLARITIN) 10 MG tablet Take 10 mg by mouth daily.     riTUXimab (RITUXAN IV) Inject into the vein every 21 ( twenty-one) days.     sertraline (ZOLOFT) 100 MG tablet Take by mouth daily.     simvastatin (ZOCOR) 10 MG tablet Take 1 tablet by mouth at bedtime.     tamsulosin (FLOMAX) 0.4 MG CAPS capsule Take 0.4 mg by mouth daily.     No current facility-administered medications on file prior to visit.    No Known Allergies  Social History   Substance and Sexual Activity  Alcohol Use Not Currently    Social History   Tobacco Use  Smoking Status Never  Smokeless Tobacco Never    Review of Systems  Constitutional:  Positive for chills, fever and malaise/fatigue.  HENT:  Positive for  sinus pain.   Eyes:  Positive for pain.  Respiratory:  Positive for shortness of breath and wheezing.   Cardiovascular: Negative.   Gastrointestinal:  Positive for heartburn and nausea.  Genitourinary:  Positive for frequency.  Musculoskeletal:  Positive for back pain, joint pain and neck pain.  Skin:  Positive for rash.  Neurological: Negative.   Endo/Heme/Allergies: Negative.   Psychiatric/Behavioral: Negative.     Objective   Vitals:   07/09/21 1059  BP: 112/62  Pulse: 92  Resp: 16  Temp: 99.8 F (37.7 C)  SpO2: 93%    Physical Exam Vitals reviewed.  Constitutional:      Appearance: Normal appearance. He is not ill-appearing.  HENT:     Head: Normocephalic and atraumatic.  Cardiovascular:     Rate and Rhythm: Normal rate and regular rhythm.     Heart sounds: Normal heart sounds. No murmur heard.   No friction rub. No gallop.  Pulmonary:     Effort: Pulmonary effort is normal. No respiratory distress.     Breath sounds: Normal breath sounds. No stridor. No wheezing, rhonchi or rales.  Skin:    General: Skin is warm and dry.  Neurological:     Mental Status: He is alert and oriented to person,  place, and time.  Axilla: I was able to palpate abnormal lymphadenopathy in the left axilla which was easier to assess than the right axilla. PET scan results reviewed  Assessment  Mantle cell lymphoma, generalized lymphadenopathy, need for lymph node biopsy for flow cytometry  Plan  Patient is scheduled for an axillary lymph node biopsy, left on 07/20/2021.  The risks and benefits of the procedure including bleeding, infection, and the possibility of needing further biopsy were fully explained to the patient, who gave informed consent.

## 2021-07-10 NOTE — Progress Notes (Signed)
Nicholas Lawrence; [id number]; Apr 19, 1951   HPI Patient is a 70 year old black male who was referred to my care by Dr. Delton Coombes of oncology for a lymph node biopsy.  Patient has a history of mantle cell lymphoma but needs more tissue for further diagnosis and treatment plan.  He has generalized lymphadenopathy. History reviewed. No pertinent past medical history.  Past Surgical History:  Procedure Laterality Date   IR IMAGING GUIDED PORT INSERTION  06/22/2021   KIDNEY STONE SURGERY     PROSTATECTOMY      Family History  Family history unknown: Yes    Current Outpatient Medications on File Prior to Visit  Medication Sig Dispense Refill   allopurinol (ZYLOPRIM) 300 MG tablet Take 1 tablet (300 mg total) by mouth daily. 30 tablet 2   aspirin 81 MG EC tablet Take 1 tablet by mouth daily.     BENDAMUSTINE HCL IV Inject into the vein every 21 ( twenty-one) days. Days 1 & 2 every 21 days     Cholecalciferol (VITAMIN D3) 1.25 MG (50000 UT) TABS Take 125 mcg by mouth daily at 2 am.     finasteride (PROSCAR) 5 MG tablet Take 5 mg by mouth daily.     glipiZIDE (GLUCOTROL) 5 MG tablet Take by mouth daily before breakfast.     lisinopril (ZESTRIL) 40 MG tablet Take 40 mg by mouth daily.     loratadine (CLARITIN) 10 MG tablet Take 10 mg by mouth daily.     riTUXimab (RITUXAN IV) Inject into the vein every 21 ( twenty-one) days.     sertraline (ZOLOFT) 100 MG tablet Take by mouth daily.     simvastatin (ZOCOR) 10 MG tablet Take 1 tablet by mouth at bedtime.     tamsulosin (FLOMAX) 0.4 MG CAPS capsule Take 0.4 mg by mouth daily.     No current facility-administered medications on file prior to visit.    No Known Allergies  Social History   Substance and Sexual Activity  Alcohol Use Not Currently    Social History   Tobacco Use  Smoking Status Never  Smokeless Tobacco Never    Review of Systems  Constitutional:  Positive for chills, fever and malaise/fatigue.  HENT:  Positive for  sinus pain.   Eyes:  Positive for pain.  Respiratory:  Positive for shortness of breath and wheezing.   Cardiovascular: Negative.   Gastrointestinal:  Positive for heartburn and nausea.  Genitourinary:  Positive for frequency.  Musculoskeletal:  Positive for back pain, joint pain and neck pain.  Skin:  Positive for rash.  Neurological: Negative.   Endo/Heme/Allergies: Negative.   Psychiatric/Behavioral: Negative.     Objective   Vitals:   07/09/21 1059  BP: 112/62  Pulse: 92  Resp: 16  Temp: 99.8 F (37.7 C)  SpO2: 93%    Physical Exam Vitals reviewed.  Constitutional:      Appearance: Normal appearance. He is not ill-appearing.  HENT:     Head: Normocephalic and atraumatic.  Cardiovascular:     Rate and Rhythm: Normal rate and regular rhythm.     Heart sounds: Normal heart sounds. No murmur heard.   No friction rub. No gallop.  Pulmonary:     Effort: Pulmonary effort is normal. No respiratory distress.     Breath sounds: Normal breath sounds. No stridor. No wheezing, rhonchi or rales.  Skin:    General: Skin is warm and dry.  Neurological:     Mental Status: He is alert and oriented to person,  place, and time.  Axilla: I was able to palpate abnormal lymphadenopathy in the left axilla which was easier to assess than the right axilla. PET scan results reviewed  Assessment  Mantle cell lymphoma, generalized lymphadenopathy, need for lymph node biopsy for flow cytometry  Plan  Patient is scheduled for an axillary lymph node biopsy, left on 07/20/2021.  The risks and benefits of the procedure including bleeding, infection, and the possibility of needing further biopsy were fully explained to the patient, who gave informed consent.

## 2021-07-10 NOTE — Telephone Encounter (Signed)
Patient arrived to the clinic and states, " a man called me and asked me to bring this medication to the clinic. " Allopurinol 300 mg tablet daily PO. Medication on MAR.

## 2021-07-17 ENCOUNTER — Other Ambulatory Visit: Payer: Self-pay

## 2021-07-17 ENCOUNTER — Encounter (HOSPITAL_COMMUNITY)
Admission: RE | Admit: 2021-07-17 | Discharge: 2021-07-17 | Disposition: A | Discharge status: Home / Self Care | Payer: Non-veteran care | Source: Ambulatory Visit | Attending: General Surgery | Admitting: General Surgery

## 2021-07-17 ENCOUNTER — Encounter (HOSPITAL_COMMUNITY): Payer: Self-pay

## 2021-07-17 HISTORY — DX: Benign prostatic hyperplasia without lower urinary tract symptoms: N40.0

## 2021-07-17 HISTORY — DX: Type 2 diabetes mellitus without complications: E11.9

## 2021-07-17 HISTORY — DX: Sleep apnea, unspecified: G47.30

## 2021-07-17 HISTORY — DX: Pure hypercholesterolemia, unspecified: E78.00

## 2021-07-20 ENCOUNTER — Encounter (HOSPITAL_COMMUNITY)
Admission: RE | Disposition: A | Discharge status: Home / Self Care | Payer: Self-pay | Source: Ambulatory Visit | Attending: General Surgery

## 2021-07-20 ENCOUNTER — Observation Stay (HOSPITAL_COMMUNITY)
Admission: RE | Admit: 2021-07-20 | Discharge: 2021-07-21 | Disposition: A | Discharge status: Home / Self Care | Payer: No Typology Code available for payment source | Source: Ambulatory Visit | Attending: General Surgery | Admitting: General Surgery

## 2021-07-20 ENCOUNTER — Encounter (HOSPITAL_COMMUNITY): Payer: Self-pay | Admitting: General Surgery

## 2021-07-20 ENCOUNTER — Ambulatory Visit (HOSPITAL_COMMUNITY): Payer: No Typology Code available for payment source | Admitting: Certified Registered Nurse Anesthetist

## 2021-07-20 ENCOUNTER — Other Ambulatory Visit: Payer: Self-pay

## 2021-07-20 DIAGNOSIS — Z7984 Long term (current) use of oral hypoglycemic drugs: Secondary | ICD-10-CM | POA: Diagnosis not present

## 2021-07-20 DIAGNOSIS — I10 Essential (primary) hypertension: Secondary | ICD-10-CM | POA: Insufficient documentation

## 2021-07-20 DIAGNOSIS — Z79899 Other long term (current) drug therapy: Secondary | ICD-10-CM | POA: Insufficient documentation

## 2021-07-20 DIAGNOSIS — Z20822 Contact with and (suspected) exposure to covid-19: Secondary | ICD-10-CM | POA: Diagnosis not present

## 2021-07-20 DIAGNOSIS — E7800 Pure hypercholesterolemia, unspecified: Secondary | ICD-10-CM | POA: Diagnosis not present

## 2021-07-20 DIAGNOSIS — R591 Generalized enlarged lymph nodes: Secondary | ICD-10-CM | POA: Diagnosis not present

## 2021-07-20 DIAGNOSIS — E119 Type 2 diabetes mellitus without complications: Secondary | ICD-10-CM | POA: Insufficient documentation

## 2021-07-20 DIAGNOSIS — C9590 Leukemia, unspecified not having achieved remission: Secondary | ICD-10-CM | POA: Diagnosis present

## 2021-07-20 DIAGNOSIS — D696 Thrombocytopenia, unspecified: Secondary | ICD-10-CM | POA: Diagnosis not present

## 2021-07-20 DIAGNOSIS — Z7982 Long term (current) use of aspirin: Secondary | ICD-10-CM | POA: Insufficient documentation

## 2021-07-20 DIAGNOSIS — C8314 Mantle cell lymphoma, lymph nodes of axilla and upper limb: Principal | ICD-10-CM | POA: Insufficient documentation

## 2021-07-20 HISTORY — PX: AXILLARY LYMPH NODE BIOPSY: SHX5737

## 2021-07-20 LAB — SARS CORONAVIRUS 2 BY RT PCR (HOSPITAL ORDER, PERFORMED IN ~~LOC~~ HOSPITAL LAB): SARS Coronavirus 2: NEGATIVE

## 2021-07-20 LAB — GLUCOSE, CAPILLARY: Glucose-Capillary: 99 mg/dL (ref 70–99)

## 2021-07-20 SURGERY — AXILLARY LYMPH NODE BIOPSY
Anesthesia: General | Site: Axilla | Laterality: Left

## 2021-07-20 MED ORDER — EPHEDRINE SULFATE 50 MG/ML IJ SOLN
INTRAMUSCULAR | Status: DC | PRN
  Administered 2021-07-20: 10 mg via INTRAVENOUS
  Administered 2021-07-20: 15 mg via INTRAVENOUS

## 2021-07-20 MED ORDER — PHENYLEPHRINE 40 MCG/ML (10ML) SYRINGE FOR IV PUSH (FOR BLOOD PRESSURE SUPPORT)
PREFILLED_SYRINGE | INTRAVENOUS | Status: AC
  Filled 2021-07-20: qty 20

## 2021-07-20 MED ORDER — CEFAZOLIN SODIUM-DEXTROSE 2-4 GM/100ML-% IV SOLN
2.0000 g | INTRAVENOUS | Status: AC
  Administered 2021-07-20: 2 g via INTRAVENOUS

## 2021-07-20 MED ORDER — KETOROLAC TROMETHAMINE 15 MG/ML IJ SOLN: 15.0000 mg | Freq: Four times a day (QID) | INTRAMUSCULAR | Status: DC | PRN

## 2021-07-20 MED ORDER — ACETAMINOPHEN 650 MG RE SUPP
650.0000 mg | Freq: Four times a day (QID) | RECTAL | Status: DC | PRN
  Filled 2021-07-20: qty 1

## 2021-07-20 MED ORDER — KETOROLAC TROMETHAMINE 15 MG/ML IJ SOLN
15.0000 mg | Freq: Four times a day (QID) | INTRAMUSCULAR | Status: AC
  Administered 2021-07-20: 15 mg via INTRAVENOUS

## 2021-07-20 MED ORDER — CHLORHEXIDINE GLUCONATE CLOTH 2 % EX PADS: 6.0000 | MEDICATED_PAD | Freq: Once | CUTANEOUS | Status: DC

## 2021-07-20 MED ORDER — ONDANSETRON HCL 4 MG/2ML IJ SOLN
INTRAMUSCULAR | Status: AC
  Filled 2021-07-20: qty 2

## 2021-07-20 MED ORDER — ALLOPURINOL 100 MG PO TABS
300.0000 mg | ORAL_TABLET | Freq: Every day | ORAL | Status: DC
Start: 2021-07-20 — End: 2021-07-21
  Administered 2021-07-20: 300 mg via ORAL
  Filled 2021-07-20: qty 3
  Filled 2021-07-20 (×2): qty 1

## 2021-07-20 MED ORDER — ONDANSETRON HCL 4 MG/2ML IJ SOLN: 4.0000 mg | Freq: Four times a day (QID) | INTRAMUSCULAR | Status: DC | PRN

## 2021-07-20 MED ORDER — PHENYLEPHRINE 40 MCG/ML (10ML) SYRINGE FOR IV PUSH (FOR BLOOD PRESSURE SUPPORT)
PREFILLED_SYRINGE | INTRAVENOUS | Status: AC
  Filled 2021-07-20: qty 10

## 2021-07-20 MED ORDER — FENTANYL CITRATE (PF) 100 MCG/2ML IJ SOLN
INTRAMUSCULAR | Status: AC
  Filled 2021-07-20: qty 2

## 2021-07-20 MED ORDER — DEXAMETHASONE SODIUM PHOSPHATE 4 MG/ML IJ SOLN
INTRAMUSCULAR | Status: DC | PRN
  Administered 2021-07-20: 8 mg via INTRAVENOUS

## 2021-07-20 MED ORDER — SEVOFLURANE IN SOLN
RESPIRATORY_TRACT | Status: AC
  Filled 2021-07-20: qty 250

## 2021-07-20 MED ORDER — ALBUTEROL SULFATE HFA 108 (90 BASE) MCG/ACT IN AERS
INHALATION_SPRAY | RESPIRATORY_TRACT | Status: AC
  Filled 2021-07-20: qty 6.7

## 2021-07-20 MED ORDER — PROPOFOL 10 MG/ML IV BOLUS
INTRAVENOUS | Status: AC
  Filled 2021-07-20: qty 20

## 2021-07-20 MED ORDER — 0.9 % SODIUM CHLORIDE (POUR BTL) OPTIME
TOPICAL | Status: DC | PRN
  Administered 2021-07-20: 1000 mL

## 2021-07-20 MED ORDER — SERTRALINE HCL 50 MG PO TABS
100.0000 mg | ORAL_TABLET | Freq: Every day | ORAL | Status: DC
  Administered 2021-07-20: 100 mg via ORAL
  Filled 2021-07-20: qty 2
  Filled 2021-07-20 (×2): qty 1

## 2021-07-20 MED ORDER — SIMVASTATIN 20 MG PO TABS
10.0000 mg | ORAL_TABLET | Freq: Every day | ORAL | Status: DC
  Filled 2021-07-20 (×2): qty 1

## 2021-07-20 MED ORDER — CHLORHEXIDINE GLUCONATE 0.12 % MT SOLN
OROMUCOSAL | Status: AC
  Filled 2021-07-20: qty 15

## 2021-07-20 MED ORDER — FINASTERIDE 5 MG PO TABS
5.0000 mg | ORAL_TABLET | Freq: Every day | ORAL | Status: DC
  Filled 2021-07-20: qty 1

## 2021-07-20 MED ORDER — BUPIVACAINE LIPOSOME 1.3 % IJ SUSP
INTRAMUSCULAR | Status: AC
  Filled 2021-07-20: qty 20

## 2021-07-20 MED ORDER — ONDANSETRON HCL 4 MG/2ML IJ SOLN
INTRAMUSCULAR | Status: DC | PRN
  Administered 2021-07-20: 4 mg via INTRAVENOUS

## 2021-07-20 MED ORDER — MORPHINE SULFATE (PF) 2 MG/ML IV SOLN: 2.0000 mg | INTRAVENOUS | Status: DC | PRN

## 2021-07-20 MED ORDER — SODIUM CHLORIDE 0.9 % IV SOLN: INTRAVENOUS | Status: DC

## 2021-07-20 MED ORDER — LIDOCAINE HCL (CARDIAC) PF 100 MG/5ML IV SOSY
PREFILLED_SYRINGE | INTRAVENOUS | Status: DC | PRN
  Administered 2021-07-20: 60 mg via INTRAVENOUS

## 2021-07-20 MED ORDER — HYDROCODONE-ACETAMINOPHEN 5-325 MG PO TABS: 1.0000 | ORAL_TABLET | ORAL | Status: DC | PRN

## 2021-07-20 MED ORDER — GLIPIZIDE 5 MG PO TABS
5.0000 mg | ORAL_TABLET | Freq: Every day | ORAL | Status: DC
  Administered 2021-07-21: 5 mg via ORAL
  Filled 2021-07-20 (×2): qty 1

## 2021-07-20 MED ORDER — ACETAMINOPHEN 325 MG PO TABS: 650.0000 mg | ORAL_TABLET | Freq: Four times a day (QID) | ORAL | Status: DC | PRN

## 2021-07-20 MED ORDER — LACTATED RINGERS IV SOLN
INTRAVENOUS | Status: DC | PRN
Start: 2021-07-20 — End: 2021-07-20

## 2021-07-20 MED ORDER — FENTANYL CITRATE (PF) 100 MCG/2ML IJ SOLN
INTRAMUSCULAR | Status: DC | PRN
  Administered 2021-07-20 (×2): 50 ug via INTRAVENOUS

## 2021-07-20 MED ORDER — KETOROLAC TROMETHAMINE 15 MG/ML IJ SOLN
INTRAMUSCULAR | Status: AC
  Filled 2021-07-20: qty 1

## 2021-07-20 MED ORDER — SIMETHICONE 80 MG PO CHEW
40.0000 mg | CHEWABLE_TABLET | Freq: Four times a day (QID) | ORAL | Status: DC | PRN
Start: 2021-07-20 — End: 2021-07-21
  Filled 2021-07-20: qty 1

## 2021-07-20 MED ORDER — CEFAZOLIN SODIUM-DEXTROSE 2-4 GM/100ML-% IV SOLN
INTRAVENOUS | Status: AC
  Filled 2021-07-20: qty 100

## 2021-07-20 MED ORDER — LORATADINE 10 MG PO TABS
10.0000 mg | ORAL_TABLET | Freq: Every day | ORAL | Status: DC
Start: 2021-07-20 — End: 2021-07-21
  Administered 2021-07-20: 10 mg via ORAL
  Filled 2021-07-20: qty 1

## 2021-07-20 MED ORDER — ONDANSETRON 4 MG PO TBDP: 4.0000 mg | ORAL_TABLET | Freq: Four times a day (QID) | ORAL | Status: DC | PRN

## 2021-07-20 MED ORDER — TAMSULOSIN HCL 0.4 MG PO CAPS
0.4000 mg | ORAL_CAPSULE | Freq: Every day | ORAL | Status: DC
  Administered 2021-07-20: 0.4 mg via ORAL
  Filled 2021-07-20 (×3): qty 1

## 2021-07-20 MED ORDER — PROPOFOL 10 MG/ML IV BOLUS
INTRAVENOUS | Status: DC | PRN
  Administered 2021-07-20: 50 mg via INTRAVENOUS
  Administered 2021-07-20: 150 mg via INTRAVENOUS

## 2021-07-20 MED ORDER — PHENYLEPHRINE HCL (PRESSORS) 10 MG/ML IV SOLN
INTRAVENOUS | Status: DC | PRN
  Administered 2021-07-20 (×3): 80 ug via INTRAVENOUS
  Administered 2021-07-20: 120 ug via INTRAVENOUS

## 2021-07-20 MED ORDER — ENOXAPARIN SODIUM 40 MG/0.4ML IJ SOSY: 40.0000 mg | PREFILLED_SYRINGE | INTRAMUSCULAR | Status: DC

## 2021-07-20 MED ORDER — LISINOPRIL 10 MG PO TABS
40.0000 mg | ORAL_TABLET | Freq: Every day | ORAL | Status: DC
Start: 2021-07-20 — End: 2021-07-21
  Administered 2021-07-20: 40 mg via ORAL
  Filled 2021-07-20: qty 4

## 2021-07-20 MED ORDER — BUPIVACAINE LIPOSOME 1.3 % IJ SUSP
INTRAMUSCULAR | Status: DC | PRN
  Administered 2021-07-20: 20 mL

## 2021-07-20 MED ORDER — DEXAMETHASONE SODIUM PHOSPHATE 10 MG/ML IJ SOLN
INTRAMUSCULAR | Status: AC
  Filled 2021-07-20: qty 1

## 2021-07-20 MED ORDER — LIDOCAINE HCL (PF) 2 % IJ SOLN
INTRAMUSCULAR | Status: AC
  Filled 2021-07-20: qty 5

## 2021-07-20 SURGICAL SUPPLY — 35 items
ADH SKN CLS APL DERMABOND .7 (GAUZE/BANDAGES/DRESSINGS) ×1
APL PRP STRL LF DISP 70% ISPRP (MISCELLANEOUS) ×1
BLADE SURG 15 STRL LF DISP TIS (BLADE) ×1 IMPLANT
BLADE SURG 15 STRL SS (BLADE) ×2
CHLORAPREP W/TINT 26 (MISCELLANEOUS) ×2 IMPLANT
CLOTH BEACON ORANGE TIMEOUT ST (SAFETY) ×2 IMPLANT
CNTNR URN SCR LID CUP LEK RST (MISCELLANEOUS) ×1 IMPLANT
CONT SPEC 4OZ STRL OR WHT (MISCELLANEOUS) ×2
COVER LIGHT HANDLE STERIS (MISCELLANEOUS) ×4 IMPLANT
DERMABOND ADVANCED (GAUZE/BANDAGES/DRESSINGS) ×1
DERMABOND ADVANCED .7 DNX12 (GAUZE/BANDAGES/DRESSINGS) ×1 IMPLANT
ELECT REM PT RETURN 9FT ADLT (ELECTROSURGICAL) ×2
ELECTRODE REM PT RTRN 9FT ADLT (ELECTROSURGICAL) ×1 IMPLANT
GLOVE SURG POLYISO LF SZ6.5 (GLOVE) ×1 IMPLANT
GLOVE SURG POLYISO LF SZ7 (GLOVE) ×1 IMPLANT
GLOVE SURG POLYISO LF SZ7.5 (GLOVE) ×2 IMPLANT
GLOVE SURG UNDER POLY LF SZ6.5 (GLOVE) ×1 IMPLANT
GLOVE SURG UNDER POLY LF SZ7 (GLOVE) ×4 IMPLANT
GOWN STRL REUS W/TWL LRG LVL3 (GOWN DISPOSABLE) ×5 IMPLANT
INST SET MINOR GENERAL (KITS) ×2 IMPLANT
KIT TURNOVER KIT A (KITS) ×2 IMPLANT
MANIFOLD NEPTUNE II (INSTRUMENTS) ×2 IMPLANT
NDL HYPO 18GX1.5 BLUNT FILL (NEEDLE) ×1 IMPLANT
NDL HYPO 21X1.5 SAFETY (NEEDLE) ×1 IMPLANT
NEEDLE HYPO 18GX1.5 BLUNT FILL (NEEDLE) ×2 IMPLANT
NEEDLE HYPO 21X1.5 SAFETY (NEEDLE) ×2 IMPLANT
PACK MINOR (CUSTOM PROCEDURE TRAY) ×2 IMPLANT
PAD ARMBOARD 7.5X6 YLW CONV (MISCELLANEOUS) ×2 IMPLANT
SET BASIN LINEN APH (SET/KITS/TRAYS/PACK) ×2 IMPLANT
SHEARS HARMONIC 9CM CVD (BLADE) ×1 IMPLANT
SPONGE T-LAP 18X18 ~~LOC~~+RFID (SPONGE) ×2 IMPLANT
SUT MNCRL AB 4-0 PS2 18 (SUTURE) ×2 IMPLANT
SUT VIC AB 3-0 SH 27 (SUTURE) ×2
SUT VIC AB 3-0 SH 27X BRD (SUTURE) ×1 IMPLANT
SYR 20ML LL LF (SYRINGE) ×4 IMPLANT

## 2021-07-20 NOTE — Interval H&P Note (Signed)
History and Physical Interval Note:  07/20/2021 9:49 AM  Nicholas Lawrence  has presented today for surgery, with the diagnosis of mantle cell lymphoma.  The various methods of treatment have been discussed with the patient and family. After consideration of risks, benefits and other options for treatment, the patient has consented to  Procedure(s): AXILLARY LYMPH NODE BIOPSY (Left) as a surgical intervention.  The patient's history has been reviewed, patient examined, no change in status, stable for surgery.  I have reviewed the patient's chart and labs.  Questions were answered to the patient's satisfaction.     Aviva Signs

## 2021-07-20 NOTE — Transfer of Care (Signed)
Immediate Anesthesia Transfer of Care Note  Patient: Nicholas Lawrence  Procedure(s) Performed: AXILLARY LYMPH NODE BIOPSY (Left: Axilla)  Patient Location: PACU  Anesthesia Type:General  Level of Consciousness: awake, alert  and oriented  Airway & Oxygen Therapy: Patient Spontanous Breathing  Post-op Assessment: Report given to RN and Post -op Vital signs reviewed and stable  Post vital signs: Reviewed and stable  Last Vitals:  Vitals Value Taken Time  BP 103/62 07/20/21 1111  Temp    Pulse 87 07/20/21 1114  Resp 19 07/20/21 1114  SpO2 95 % 07/20/21 1114  Vitals shown include unvalidated device data.  Last Pain:  Vitals:   07/20/21 0949  TempSrc: Oral      Patients Stated Pain Goal: 5 (16/10/96 0454)  Complications: No notable events documented.

## 2021-07-20 NOTE — Anesthesia Preprocedure Evaluation (Signed)
Anesthesia Evaluation  Patient identified by MRN, date of birth, ID band Patient awake    Reviewed: Allergy & Precautions, H&P , NPO status , Patient's Chart, lab work & pertinent test results, reviewed documented beta blocker date and time   Airway Mallampati: II  TM Distance: >3 FB Neck ROM: full    Dental no notable dental hx.    Pulmonary sleep apnea ,    Pulmonary exam normal breath sounds clear to auscultation       Cardiovascular Exercise Tolerance: Good negative cardio ROS   Rhythm:regular Rate:Normal     Neuro/Psych negative neurological ROS  negative psych ROS   GI/Hepatic negative GI ROS, Neg liver ROS,   Endo/Other  negative endocrine ROSdiabetes, Type 2  Renal/GU negative Renal ROS  negative genitourinary   Musculoskeletal   Abdominal   Peds  Hematology negative hematology ROS (+)   Anesthesia Other Findings   Reproductive/Obstetrics negative OB ROS                             Anesthesia Physical Anesthesia Plan  ASA: 2  Anesthesia Plan: General   Post-op Pain Management:    Induction:   PONV Risk Score and Plan: Propofol infusion  Airway Management Planned:   Additional Equipment:   Intra-op Plan:   Post-operative Plan:   Informed Consent: I have reviewed the patients History and Physical, chart, labs and discussed the procedure including the risks, benefits and alternatives for the proposed anesthesia with the patient or authorized representative who has indicated his/her understanding and acceptance.     Dental Advisory Given  Plan Discussed with: CRNA  Anesthesia Plan Comments:         Anesthesia Quick Evaluation

## 2021-07-20 NOTE — Op Note (Signed)
Patient:  Nicholas Lawrence  DOB:  [birth year]  MRN:  [id number]   Preop Diagnosis: Mantle cell lymphoma, generalized lymphadenopathy  Postop Diagnosis: Same  Procedure: Left axillary lymph node biopsy  Surgeon: Aviva Signs, MD  Anes: General  Indications: Patient is a 71 year old black male who presents for a left axillary lymph node biopsy due to the need for more tissue diagnosis for his mantle cell lymphoma.  The risks and benefits of the procedure including bleeding, infection, left arm swelling, and pain were fully explained to the patient, who gave informed consent.  Procedure note: The patient was placed in supine position.  After general anesthesia was administered, the left axilla was prepped and draped using the usual sterile technique with ChloraPrep.  Surgical site confirmation was performed.  Incision was made in the left axilla.  The dissection was taken down to the deep soft tissue.  2 enlarged lymph nodes were found.  They were removed using the LigaSure.  They were sent to pathology for immediate evaluation and flow cytometry.  No abnormal bleeding was noted at the end of the procedure.  Exparel was instilled into the surrounding wound.  The subcutaneous layer was reapproximated using a 3-0 Vicryl interrupted suture.  The skin was closed using a 4-0 Monocryl subcuticular suture.  Dermabond was applied.  All tape and needle counts were correct at the end of the procedure.  The patient was awakened and transferred to PACU in stable condition.  Complications: None  EBL: Minimal  Specimen: Left axillary lymph nodes

## 2021-07-20 NOTE — Anesthesia Postprocedure Evaluation (Signed)
Anesthesia Post Note  Patient: Nicholas Lawrence  Procedure(s) Performed: AXILLARY LYMPH NODE BIOPSY (Left: Axilla)  Patient location during evaluation: Phase II Anesthesia Type: General Level of consciousness: awake Pain management: pain level controlled Vital Signs Assessment: post-procedure vital signs reviewed and stable Respiratory status: spontaneous breathing and respiratory function stable Cardiovascular status: blood pressure returned to baseline and stable Postop Assessment: no headache and no apparent nausea or vomiting Anesthetic complications: no Comments: Late entry   No notable events documented.   Last Vitals:  Vitals:   07/20/21 1145 07/20/21 1200  BP: (!) 109/59 106/61  Pulse: 78 80  Resp: 15 16  Temp:  36.7 C  SpO2: 100% 96%    Last Pain:  Vitals:   07/20/21 1200  TempSrc: Oral  PainSc:                  Louann Sjogren

## 2021-07-20 NOTE — Anesthesia Procedure Notes (Addendum)
Procedure Name: LMA Insertion Date/Time: 07/20/2021 10:37 AM Performed by: Minerva Ends, CRNA Pre-anesthesia Checklist: Patient identified, Emergency Drugs available, Suction available and Patient being monitored Patient Re-evaluated:Patient Re-evaluated prior to induction Oxygen Delivery Method: Circle system utilized Preoxygenation: Pre-oxygenation with 100% oxygen Induction Type: IV induction LMA: LMA inserted LMA Size: 4.0 Tube type: Oral Number of attempts: 1 Placement Confirmation: ETT inserted through vocal cords under direct vision, positive ETCO2 and breath sounds checked- equal and bilateral Tube secured with: Tape Dental Injury: Teeth and Oropharynx as per pre-operative assessment

## 2021-07-20 NOTE — Plan of Care (Signed)

## 2021-07-21 ENCOUNTER — Encounter (HOSPITAL_COMMUNITY): Payer: Self-pay | Admitting: General Surgery

## 2021-07-21 DIAGNOSIS — C8314 Mantle cell lymphoma, lymph nodes of axilla and upper limb: Secondary | ICD-10-CM | POA: Diagnosis not present

## 2021-07-21 LAB — BASIC METABOLIC PANEL
Anion gap: 6 (ref 5–15)
BUN: 31 mg/dL — ABNORMAL HIGH (ref 8–23)
CO2: 24 mmol/L (ref 22–32)
Calcium: 9 mg/dL (ref 8.9–10.3)
Chloride: 106 mmol/L (ref 98–111)
Creatinine, Ser: 1.57 mg/dL — ABNORMAL HIGH (ref 0.61–1.24)
GFR, Estimated: 47 mL/min — ABNORMAL LOW (ref >60)
Glucose, Bld: 74 mg/dL (ref 70–99)
Potassium: 4.8 mmol/L (ref 3.5–5.1)
Sodium: 136 mmol/L (ref 135–145)

## 2021-07-21 LAB — CBC
HCT: 30.9 % — ABNORMAL LOW (ref 39.0–52.0)
Hemoglobin: 8.8 g/dL — ABNORMAL LOW (ref 13.0–17.0)
MCH: 22.3 pg — ABNORMAL LOW (ref 26.0–34.0)
MCHC: 28.5 g/dL — ABNORMAL LOW (ref 30.0–36.0)
MCV: 78.2 fL — ABNORMAL LOW (ref 80.0–100.0)
Platelets: 117 10*3/uL — ABNORMAL LOW (ref 150–400)
RBC: 3.95 MIL/uL — ABNORMAL LOW (ref 4.22–5.81)
RDW: 18.5 % — ABNORMAL HIGH (ref 11.5–15.5)
WBC: 54.7 10*3/uL (ref 4.0–10.5)
nRBC: 0 % (ref 0.0–0.2)

## 2021-07-21 MED ORDER — LACTATED RINGERS IV SOLN: INTRAVENOUS | Status: DC

## 2021-07-21 MED ORDER — ORAL CARE MOUTH RINSE: 15.0000 mL | Freq: Once | OROMUCOSAL | Status: DC

## 2021-07-21 MED ORDER — PROPOFOL 500 MG/50ML IV EMUL
INTRAVENOUS | Status: AC
  Filled 2021-07-21: qty 200

## 2021-07-21 MED ORDER — CHLORHEXIDINE GLUCONATE 0.12 % MT SOLN: 15.0000 mL | Freq: Once | OROMUCOSAL | Status: DC

## 2021-07-21 NOTE — Discharge Summary (Signed)
Physician Discharge Summary  Patient ID: Nicholas Lawrence MRN: [id number] DOB/AGE: [birth year] 71 y.o.  Admit date: 07/20/2021 Discharge date: 07/21/2021  Admission Diagnoses: Mantle cell lymphoma  Discharge Diagnoses: Same Hypertension, non-insulin-dependent diabetes mellitus, high cholesterol levels, thrombocytopenia  Discharged Condition: good  Hospital Course: Patient is a 71 year old black male who was referred to my care by Dr. Delton Coombes of oncology for a left axillary lymph node biopsy.  Patient does have a history of mantle cell lymphoma.  He underwent a left axillary lymph node biopsy on 07/20/2021.  Tolerated the procedure well.  His postoperative course has been unremarkable.  His diet was advanced out difficulty.  The patient is being discharged home on 07/21/2021 in good and improving condition.  Treatments: surgery: Left axillary lymph node biopsy on 07/20/2021  Discharge Exam: Blood pressure (!) 123/56, pulse 61, temperature 98.2 F (36.8 C), resp. rate 17, height 5\' 6"  (1.676 m), weight 72.2 kg, SpO2 95 %. General appearance: alert, cooperative, and no distress Resp: clear to auscultation bilaterally Cardio: regular rate and rhythm, S1, S2 normal, no murmur, click, rub or gallop Left axillary incision healing well.  Disposition: Discharge disposition: 01-Home or Self Care       Discharge Instructions     Diet - low sodium heart healthy   Complete by: As directed    Increase activity slowly   Complete by: As directed       Allergies as of 07/21/2021   No Known Allergies      Medication List     TAKE these medications    allopurinol 300 MG tablet Commonly known as: ZYLOPRIM Take 1 tablet (300 mg total) by mouth daily.   aspirin 81 MG EC tablet Take 1 tablet by mouth daily.   BENDAMUSTINE HCL IV Inject into the vein every 21 ( twenty-one) days. Days 1 & 2 every 21 days   finasteride 5 MG tablet Commonly known as: PROSCAR Take 5 mg by mouth daily.    finasteride 5 MG tablet Commonly known as: PROSCAR Take 1 tablet by mouth daily.   glipiZIDE 5 MG tablet Commonly known as: GLUCOTROL Take by mouth daily before breakfast.   lisinopril 40 MG tablet Commonly known as: ZESTRIL Take 40 mg by mouth daily.   lisinopril 40 MG tablet Commonly known as: ZESTRIL Take 1 tablet by mouth daily.   loratadine 10 MG tablet Commonly known as: CLARITIN Take 10 mg by mouth daily.   loratadine 10 MG tablet Commonly known as: CLARITIN Take 1 tablet by mouth daily.   RITUXAN IV Inject into the vein every 21 ( twenty-one) days.   sertraline 100 MG tablet Commonly known as: ZOLOFT Take by mouth daily.   simvastatin 10 MG tablet Commonly known as: ZOCOR Take 1 tablet by mouth at bedtime.   tamsulosin 0.4 MG Caps capsule Commonly known as: FLOMAX Take 0.4 mg by mouth daily.   Vitamin D3 1.25 MG (50000 UT) Tabs Take 125 mcg by mouth daily at 2 am.        Follow-up Information     Aviva Signs, MD Follow up.   Specialty: General Surgery Why: As needed Contact information: [id number] [place] [address] [phone]                 Signed: Aviva Signs 07/21/2021, 9:17 AM

## 2021-07-21 NOTE — Plan of Care (Signed)

## 2021-07-22 LAB — SURGICAL PATHOLOGY

## 2021-07-28 ENCOUNTER — Inpatient Hospital Stay (HOSPITAL_COMMUNITY): Payer: No Typology Code available for payment source | Attending: Hematology | Admitting: Hematology

## 2021-07-28 ENCOUNTER — Other Ambulatory Visit: Payer: Self-pay

## 2021-07-28 VITALS — BP 124/64 | HR 102 | Temp 97.5°F | Resp 18 | Ht 66.0 in | Wt 160.9 lb

## 2021-07-28 DIAGNOSIS — Z808 Family history of malignant neoplasm of other organs or systems: Secondary | ICD-10-CM | POA: Diagnosis not present

## 2021-07-28 DIAGNOSIS — D631 Anemia in chronic kidney disease: Secondary | ICD-10-CM | POA: Diagnosis not present

## 2021-07-28 DIAGNOSIS — N189 Chronic kidney disease, unspecified: Secondary | ICD-10-CM | POA: Insufficient documentation

## 2021-07-28 DIAGNOSIS — D509 Iron deficiency anemia, unspecified: Secondary | ICD-10-CM | POA: Insufficient documentation

## 2021-07-28 DIAGNOSIS — E119 Type 2 diabetes mellitus without complications: Secondary | ICD-10-CM | POA: Diagnosis not present

## 2021-07-28 DIAGNOSIS — Z5112 Encounter for antineoplastic immunotherapy: Secondary | ICD-10-CM | POA: Diagnosis present

## 2021-07-28 DIAGNOSIS — C8318 Mantle cell lymphoma, lymph nodes of multiple sites: Secondary | ICD-10-CM

## 2021-07-28 DIAGNOSIS — Z801 Family history of malignant neoplasm of trachea, bronchus and lung: Secondary | ICD-10-CM | POA: Diagnosis not present

## 2021-07-28 DIAGNOSIS — C8314 Mantle cell lymphoma, lymph nodes of axilla and upper limb: Secondary | ICD-10-CM | POA: Insufficient documentation

## 2021-07-28 MED ORDER — LIDOCAINE-PRILOCAINE 2.5-2.5 % EX CREA: TOPICAL_CREAM | CUTANEOUS | 3 refills | Status: AC

## 2021-07-28 MED ORDER — PROCHLORPERAZINE MALEATE 10 MG PO TABS: 10.0000 mg | ORAL_TABLET | Freq: Four times a day (QID) | ORAL | 1 refills | Status: DC | PRN

## 2021-07-28 MED ORDER — ALLOPURINOL 300 MG PO TABS: 300.0000 mg | ORAL_TABLET | Freq: Every day | ORAL | 3 refills | Status: DC

## 2021-07-28 MED ORDER — ACYCLOVIR 400 MG PO TABS: 400.0000 mg | ORAL_TABLET | Freq: Every day | ORAL | 3 refills | Status: AC

## 2021-07-28 NOTE — Progress Notes (Signed)
START ON PATHWAY REGIMEN - Lymphoma and CLL ° ° °  A cycle is every 28 days: °    Rituximab-xxxx  °    Bendamustine  ° °**Always confirm dose/schedule in your pharmacy ordering system** ° °Patient Characteristics: °Mantle Cell Lymphoma, First Line, Aggressive Disease or Treatment Indicated, Stage II - IV, Not a Transplant Candidate °Disease Type: Not Applicable °Disease Type: Mantle Cell Lymphoma °Disease Type: Not Applicable °Line of Therapy: First Line °Was TP53 Mutation Testing Completed<= Yes, TP53 Mutation Negative °Patient Characteristics: Not a Transplant Candidate °Intent of Therapy: °Non-Curative / Palliative Intent, Discussed with Patient °

## 2021-07-28 NOTE — Progress Notes (Signed)
Nicholas Lawrence, [place] [address]   CLINIC:  Medical Oncology/Hematology  PCP:  [facility] [place] / [place] [address] [phone]   REASON FOR VISIT:  Follow-up for mantle cell lymphoma  PRIOR THERAPY: none  NGS Results: not done  CURRENT THERAPY: under work-up  BRIEF ONCOLOGIC HISTORY:  Oncology History   No history exists.    CANCER STAGING:  Cancer Staging  Mantle cell lymphoma ([place]) Staging form: Hodgkin and Non-Hodgkin Lymphoma, AJCC 8th Edition - Clinical stage from 06/09/2021: Stage III - Unsigned   INTERVAL HISTORY:  Nicholas Lawrence, a 71 y.o. male, returns for routine follow-up of his mantle cell lymphoma. Nicholas Lawrence was last seen on 06/23/2021.   Today Nicholas Lawrence reports feeling good. Nicholas Lawrence denies ankle swellings. His appetite is good.   REVIEW OF SYSTEMS:  Review of Systems  Constitutional:  Negative for appetite change and fatigue.  Respiratory:  Positive for shortness of breath.   Cardiovascular:  Negative for leg swelling.  All other systems reviewed and are negative.  PAST MEDICAL/SURGICAL HISTORY:  Past Medical History:  Diagnosis Date   BPH (benign prostatic hyperplasia)    Diabetes mellitus without complication ([place])    Hypercholesteremia    Sleep apnea    Past Surgical History:  Procedure Laterality Date   AXILLARY LYMPH NODE BIOPSY Left 07/20/2021   Procedure: AXILLARY LYMPH NODE BIOPSY;  Surgeon: Aviva Signs, MD;  Location: [facility];  Service: General;  Laterality: Left;   IR IMAGING GUIDED PORT INSERTION  06/22/2021   KIDNEY STONE SURGERY     PROSTATECTOMY      SOCIAL HISTORY:  Social History   Socioeconomic History   Marital status: Single    Spouse name: Not on file   Number of children: Not on file   Years of education: Not on file   Highest education level: Not on file  Occupational History   Not on file  Tobacco Use   Smoking status: Never   Smokeless tobacco: Never   Vaping Use   Vaping Use: Never used  Substance and Sexual Activity   Alcohol use: Not Currently   Drug use: Not Currently   Sexual activity: Not Currently  Other Topics Concern   Not on file  Social History Narrative   Not on file   Social Determinants of Health   Financial Resource Strain: Not on file  Food Insecurity: Not on file  Transportation Needs: Not on file  Physical Activity: Not on file  Stress: Not on file  Social Connections: Not on file  Intimate Partner Violence: Not on file    FAMILY HISTORY:  Family History  Family history unknown: Yes    CURRENT MEDICATIONS:  Current Outpatient Medications  Medication Sig Dispense Refill   allopurinol (ZYLOPRIM) 300 MG tablet Take 1 tablet (300 mg total) by mouth daily. 30 tablet 2   aspirin 81 MG EC tablet Take 1 tablet by mouth daily.     BENDAMUSTINE HCL IV Inject into the vein every 21 ( twenty-one) days. Days 1 & 2 every 21 days     Cholecalciferol (VITAMIN D3) 1.25 MG (50000 UT) TABS Take 125 mcg by mouth daily at 2 am.     finasteride (PROSCAR) 5 MG tablet Take 5 mg by mouth daily.     glipiZIDE (GLUCOTROL) 5 MG tablet Take by mouth daily before breakfast.     lisinopril (ZESTRIL) 40 MG tablet Take 40 mg by mouth daily.  lisinopril (ZESTRIL) 40 MG tablet Take 1 tablet by mouth daily.     loratadine (CLARITIN) 10 MG tablet Take 10 mg by mouth daily.     loratadine (CLARITIN) 10 MG tablet Take 1 tablet by mouth daily.     riTUXimab (RITUXAN IV) Inject into the vein every 21 ( twenty-one) days.     sertraline (ZOLOFT) 100 MG tablet Take by mouth daily.     simvastatin (ZOCOR) 10 MG tablet Take 1 tablet by mouth at bedtime.     tamsulosin (FLOMAX) 0.4 MG CAPS capsule Take 0.4 mg by mouth daily.     No current facility-administered medications for this visit.    ALLERGIES:  No Known Allergies  PHYSICAL EXAM:  Performance status (ECOG): 1 - Symptomatic but completely ambulatory  Vitals:   07/28/21 1429   BP: 124/64  Pulse: (!) 102  Resp: 18  Temp: (!) 97.5 F (36.4 C)  SpO2: 95%   Wt Readings from Last 3 Encounters:  07/28/21 160 lb 14.4 oz (73 kg)  07/20/21 159 lb 3.2 oz (72.2 kg)  07/09/21 166 lb (75.3 kg)   Physical Exam Vitals reviewed.  Constitutional:      Appearance: Normal appearance.  Cardiovascular:     Rate and Rhythm: Normal rate and regular rhythm.     Pulses: Normal pulses.     Heart sounds: Normal heart sounds.  Pulmonary:     Effort: Pulmonary effort is normal.     Breath sounds: Normal breath sounds.  Chest:     Comments: Port-a-cath R side chest Abdominal:     Palpations: Abdomen is soft. There is no hepatomegaly, splenomegaly or mass.     Tenderness: There is no abdominal tenderness.  Neurological:     General: No focal deficit present.     Mental Status: Nicholas Lawrence is alert and oriented to person, place, and time.  Psychiatric:        Mood and Affect: Mood normal.        Behavior: Behavior normal.     LABORATORY DATA:  I have reviewed the labs as listed.  CBC Latest Ref Rng & Units 07/21/2021 06/23/2021 06/09/2021  WBC 4.0 - 10.5 K/uL 54.7(HH) 66.6(HH) 61.6(HH)  Hemoglobin 13.0 - 17.0 g/dL 8.8(L) 10.0(L) 10.8(L)  Hematocrit 39.0 - 52.0 % 30.9(L) 34.1(L) 35.9(L)  Platelets 150 - 400 K/uL 117(L) 98(L) 105(L)   CMP Latest Ref Rng & Units 07/21/2021 06/23/2021 06/09/2021  Glucose 70 - 99 mg/dL 74 102(H) 117(H)  BUN 8 - 23 mg/dL 31(H) 29(H) 20  Creatinine 0.61 - 1.24 mg/dL 1.57(H) 1.65(H) 1.48(H)  Sodium 135 - 145 mmol/L 136 134(L) 135  Potassium 3.5 - 5.1 mmol/L 4.8 4.2 4.3  Chloride 98 - 111 mmol/L 106 105 103  CO2 22 - 32 mmol/L 24 22 25   Calcium 8.9 - 10.3 mg/dL 9.0 9.0 9.3  Total Protein 6.5 - 8.1 g/dL - 9.0(H) 9.4(H)  Total Bilirubin 0.3 - 1.2 mg/dL - 0.6 0.4  Alkaline Phos 38 - 126 U/L - 67 80  AST 15 - 41 U/L - 16 15  ALT 0 - 44 U/L - 13 19    DIAGNOSTIC IMAGING:  I have independently reviewed the scans and discussed with the patient. No  results found.   ASSESSMENT:  Mantle cell lymphoma, stage IIIa: - 2010: Lymphocytosis - August 2019: Flow cytometry-clonal CD20 positive B cells that coexpress CD5 and surface kappa light chain.  Negative for CD10 and CD23 comprising 78% lymphocytes. - PET scan 09/09/4968: Hypermetabolic  adenopathy in the right neck, axilla, mediastinum, porta hepatis and pelvis. - PET scan on 7/0/7867: Hypermetabolic right level 2 and level 3 adenopathy, SUV 6.5.  Hypermetabolic bilateral axillary adenopathy SUV 4.3.  Mediastinal lymph node SUV 5.0.  Ill-defined lesion of the right upper lobe SUV 1.3.  Porta hepatic lymph node SUV 7.4.  Enlarged spleen with hypermetabolic activity.  Pelvic lymph nodes SUV 7.0.  Increased uptake in the posterior right prostate SUV 6.4. - Right axillary lymph node needle biopsy on 01/19/2021 consistent with mantle cell lymphoma.  SOX11 variable expression. - Nicholas Lawrence was evaluated by [facility]. Corine Shelter at [facility] and was recommended chemoimmunotherapy as his hemoglobin and platelets were trending down. - Nicholas Lawrence had voluntary weight loss of 10 pounds in the last 6 months.  Denies any fevers or night sweats. - PET scan on 06/18/2021 showed hypermetabolic lymph nodes in the right side of the neck, bilateral axillary adenopathy, mediastinal and hilar lymph nodes.  Spleen is enlarged with SUV 4.6.  Periportal lymph nodes with 16 mm celiac lymph node with SUV 11.2.  Right retroperitoneal lymph node and inguinal lymph node on the right side.    Social/family history: - Nicholas Lawrence lives by himself at home.  His daughter lives in [place]. - Nicholas Lawrence served in the [employer] and participated in the operation [place] in the [place].  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  Nicholas Lawrence is a non-smoker. - Brother died of lung cancer and paternal uncle had throat cancer.   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 - Nicholas Lawrence has high risk MIPI score. - We talked about treatment for mantle cell lymphoma.  We discussed aggressive regimen followed by bone marrow transplant versus nonaggressive regimen given his advanced age. - Given his age and comorbidities, we have opted for Bendamustine and rituximab regimen. - We discussed schedule and side effects of this regimen in detail. - Nicholas Lawrence already has a port placed.  We will likely start his treatment next week.  I will see him 2 weeks after cycle 1.  2.  Microcytic anemia: - This is from CKD and relative iron deficiency.  Last ferritin was 86 and percent saturation 13.  Hemoglobin is 8.8.  MCV 78. - Recommend Feraheme weekly x2.  Discussed side effects in detail.  3.  Hypermetabolism of the right prostate gland: - This was incidental finding on PET scan.  His PSA was mildly elevated at 4.41.  4.  TLS prophylaxis: - We will start him on allopurinol 300 mg daily. - We will give him rasburicase with the first dose of treatment.  5.  Infection prophylaxis: - We will start him on acyclovir twice daily.   Orders placed this encounter:  No orders of the defined types were placed in this encounter.    Nicholas Jack, MD [place] [phone]   I, Thana Ates, am acting as a scribe for Dr. Derek Lawrence.  I, Nicholas Jack MD, have reviewed the above documentation for accuracy and completeness, and I agree with the above.

## 2021-07-28 NOTE — Patient Instructions (Addendum)
[place] at [facility] Discharge Instructions   You were seen and examined today by Dr. Delton Coombes.  He reviewed your lab work and biopsy results with you.  Biopsy confirms mantle cell lymphoma.  We will aim to start your treatment next week.  We will plan to treat you with two different drugs - bendamustine and Rituxan.  Bendamustine is a chemo drug and Rituxan is a targeted therapy that is not chemo.  It is designed to target the lymphoma cells. You will be treated 2 days a week every 4 weeks.  On the first day, you will receive both drugs.  On the second day, you will receive only bendamustine.   Return as scheduled for lab work, office visit, and treatment.      Thank you for choosing [place] at [facility] to provide your oncology and hematology care.  To afford each patient quality time with our provider, please arrive at least 15 minutes before your scheduled appointment time.   If you have a lab appointment with the [facility] please come in thru the Main Entrance and check in at the main information desk.  You need to re-schedule your appointment should you arrive 10 or more minutes late.  We strive to give you quality time with our providers, and arriving late affects you and other patients whose appointments are after yours.  Also, if you no show three or more times for appointments you may be dismissed from the clinic at the providers discretion.     Again, thank you for choosing [facility].  Our hope is that these requests will decrease the amount of time that you wait before being seen by our physicians.       _____________________________________________________________  Should you have questions after your visit to [facility], please contact our office at [phone] and follow the prompts.  Our office hours are 8:00 a.m. and 4:30 p.m. Monday - Friday.  Please note that voicemails left after  4:00 p.m. may not be returned until the following business day.  We are closed weekends and major holidays.  You do have access to a nurse 24-7, just call the main number to the clinic [phone] and do not press any options, hold on the line and a nurse will answer the phone.    For prescription refill requests, have your pharmacy contact our office and allow 72 hours.    Due to Covid, you will need to wear a mask upon entering the hospital. If you do not have a mask, a mask will be given to you at the Main Entrance upon arrival. For doctor visits, patients may have 1 support person age 27 or older with them. For treatment visits, patients can not have anyone with them due to social distancing guidelines and our immunocompromised population.

## 2021-07-30 ENCOUNTER — Ambulatory Visit (HOSPITAL_COMMUNITY): Payer: No Typology Code available for payment source

## 2021-07-31 ENCOUNTER — Telehealth: Payer: Self-pay | Admitting: Licensed Clinical Social Worker

## 2021-07-31 NOTE — Patient Instructions (Addendum)
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  We will treat you in the clinic every 4 weeks with two different drugs.  Those drugs are rituximab (Rituxan) and bendamustine.  You will come two days in a row every 4 weeks.  On the first day you will receive both drugs.  On the second day, you will receive the bendamustine only.  We will do this for a total of 6 cycles, then we will have you come in every 2 months for maintenance therapy with Rituxan only.  The intent of treatment is to control this cancer, shrink it and prevent it from spreading further, and to alleviate any symptoms you may be having related to this lymphoma.  You will see the doctor regularly throughout treatment.  We will obtain blood work from you prior to every treatment and monitor your results to make sure it is safe to give your treatment. The doctor monitors your response to treatment by the way you are feeling, your blood work, and by obtaining scans periodically.  There will be wait times while you are here for treatment.  It will take about 30 minutes to 1 hour for your lab work to result.  Then there will be wait times while pharmacy mixes your medications.     Medications you will receive in the clinic prior to your chemotherapy medications:  Aloxi:  ALOXI is used in adults to help prevent nausea and vomiting that happens with certain chemotherapy drugs.  Aloxi is a long acting medication, and will remain in your system for about two days.   Dexamethasone:  This is a steroid given prior to chemotherapy to help prevent allergic reactions; it may also help prevent and control nausea and diarrhea.   Tylenol:  Given to prevent infusion reactions to rituximab.  Benadryl:  Antihistamine given to prevent allergic/infusion reactions to rituximab.     Rituximab (Generic Name) Other Name: Rituxan  About This Drug  Rituximab is a monoclonal antibody used to treat  cancer. This drug is given in the vein (IV).  This first time this is given it will be infused slower to monitor for infusion reactions.    Possible Side Effects (More Common)   Bone marrow depression. This is a decrease in the number of white blood cells, red blood cells, and platelets. This  may raise your risk of infection, make you tired and weak (fatigue), and raise your risk of bleeding.   Rash-skin irritation, redness or itching (dermatitis)   Flu-like symptoms: fever, headache, muscle and joint aches, and fatigue (low energy, feeling weak)   Infusion-related reactions   Hepatitis B - if you have ever had hepatitis B, the virus may come back during treatment with this drug. Your doctor will test to see if you have ever had hepatitis B prior to your treatment.   Changes in your central nervous system can happen. The central nervous system is made up of your brain and spinal cord. You could feel: extreme tiredness, agitation, confusion, or have: hallucinations (see or hear things that are not there), trouble understanding or speaking, loss of control of your bowels or bladder, eyesight changes, numbness or lack of strength to your arms, legs, face, or body, seizures or coma. If you start to have any of these symptoms let your doctor know right away.   Tumor lysis: This drug may act on the cancer cells very quickly. This may affect how your kidneys  work. Your doctor will monitor your kidney function.   Changes in your liver function. Your doctor will check your liver function as needed.   Nausea and throwing up (vomiting): these symptoms may happen within a few hours after your treatment and may last up to 24 hours. Medicines are available to stop or lessen these side effects.   Loose bowel movements (diarrhea) that may last for a few days   Abdominal pain   Infections   Cough, runny nose   Swelling of your legs, ankles and/or feet or hands   High blood pressure. Your doctor will  check your blood pressure as needed.   Abnormal heart beat  Possible Side Effects (Less Common)   Shortness of breath   Soreness of the mouth and throat. You may have red areas, white patches, or sores that hurt.  Infusion Reactions  Infusion Reactions are the most common side effect linked to use of this drug and can be quite severe. Medicines will be given before you get the drug to lower the severity of this side effect. The infusion reactions are the worse with the first dose of the drug and become less severe with more doses of the drug. While you are getting this drug in your vein (IV), tell your nurse right away if you have any of these symptoms of an allergic reaction:   Trouble catching your breath   Feeling like your tongue or throat are swelling   Feeling your heart beat quickly or in a not normal way (palpitations)   Feeling dizzy or lightheaded   Flushing, itching, rash, and/or hives   Treating Side Effects   Ask your doctor or nurse about medicine to stop or lessen headache, loose bowel movements (diarrhea), constipation, nausea, throwing up (vomiting), or pain.   If you get a rash do not put anything it unless your doctor or nurse says you may. Keep the area around the rash clean and dry. Ask your doctor for medicine if the rash bothers you.   Drink 6-8 cups of fluids each day unless your doctor has told you to limit your fluid intake due to some other health problem. A cup is 8 ounces of fluid. If you throw up or have loose bowel movements, you should drink more fluids so that you do not become dehydrated (lack of water in the body from losing too much fluid).   If you are not able to move your bowels, check with your doctor or nurse before you use enemas, laxatives, or suppositories   If you have mouth sores, avoid mouthwash that has alcohol. Also avoid alcohol and smoking because they can bother your mouth and throat.   If you have a nose bleed, sit with your  head tipped slightly forward. Apply pressure by lightly pinching the bridge of your nose between your thumb and forefinger. Call your doctor if you feel dizzy or faint or if the bleeding doesn't stop after 10 to 15 minutes   Important Information   After treatment with this drug, vaccination with live viruses should be delayed until the immune system recovers.   Symptoms of abnormal bleeding may be: coughing up blood, throwing up blood (may look like coffee grounds), red or black, tarry bowel movements, blood in urine, abnormally heavy menstrual flow, nosebleeds, or any unusual bleeding.   Symptoms of high blood pressure may be: headache, blurred vision, confusion, chest pain, or a feeling that your heart is beat differently.   Urinary tract infection.   Symptoms may include:   Pain or burning when you pass urine   Feeling like you have to pass urine often, but not much comes out when you do.   Tender or heavy feeling in your lower abdomen   Cloudy urine and/or urine that smells bad.   Pain on one side of your back under your ribs. This is where your kidneys are.   Fever, chills, nausea and/or throwing up   Food and Drug Interactions  There are no known interactions of rituximab and any food. This drug may interact with other medicines. Tell your doctor and pharmacist about all the medicines and dietary supplements (vitamins, minerals, herbs and others) that you are taking at this time. The safety and use of dietary supplements and alternative diets are often not known. Using these might affect your cancer or interfere with your treatment. Until more is known, you should not use dietary supplements or alternative diets without your cancer doctor's help.   When to Call the Doctor  Call your doctor or nurse right away if you have any of these symptoms:   Fever of 100.4 F (38 C) higher   Chills   Trouble breathing   Rash with or without itching   Blistering or peeling of  skin   Chest pain or symptoms of a heart attack. Most heart attacks involve pain in the center of the chest that lasts more than a few minutes. The pain may go away and come back or it can be constant. It can feel like pressure, squeezing, fullness, or pain. Sometimes pain is felt in one or both arms, the back, neck, jaw, or stomach. If any of these symptoms last 2 minutes, call 911   Easy bleeding or bruising   Blood in urine or bowel movements   Feeling that your heart is beating in a fast or not normal way (palpitations)   Nausea that stops you from eating or drinking   Throwing up/vomiting   Abdominal pain   Loose bowel movements (diarrhea) 4 times in one day or diarrhea with weakness or lightheadedness   No bowel movement in 3 days or if you feel uncomfortable   Feeling dizzy or lightheaded   Changes in your speech or vision   Feeling confused   Weakness of your arms and legs or poor coordination (feeling clumsy)   Signs of liver problems: dark urine, pale bowel movements, bad stomach pain, feeling very tired and weak, unusual itching, or yellowing of skin or eyes   Symptoms of a urinary tract infection (see important information)   Call your doctor or nurse as soon as possible if you have any of these symptoms:   Swelling of your legs, ankles and/or feet   Fatigue and /or weakness that interferes with your daily activities   Joint and muscle pain or muscle spasms that are not relieved by prescribed medicines   Cough that lasts longer than normal   Reproduction Concerns   Pregnancy warning: This drug is known to cross the placenta. This drug may have harmful effects on an unborn baby. Effective methods of birth control should be used during treatment with this drug and for 12 months after the last treatment. If exposure occurs to an unborn baby, the baby's immune system may be affected, which could last for months after birth. Until the immune system recovers, live  vaccines should not be administered to the baby. Be sure to talk with your doctor if you are pregnant or planning   to become pregnant while getting this drug.   Breast feeding warning: It is not known if rituximab is passed into human breast milk. In animal studies, this drug was detected in in breast milk. For this reason, women should talk to their doctor about the risks and benefits of breast feeding during treatment with this drug because this drug may enter the breast milk and badly harm a breast feeding baby.     Bendamustine (Treanda, Bendeka)   About This Drug Bendamustine is used to treat cancer. It is given in the vein (IV).  It takes 10 minutes to infuse.    Possible Side Effects  Bone marrow suppression. This is a decrease in the number of white blood cells, red blood cells, and platelets. This may raise your risk of infection, make you tired and weak (fatigue), and raise your risk of bleeding.    Soreness of the mouth and throat. You may have red areas, white patches, or sores that hurt.    Nausea and vomiting (throwing up)    Diarrhea (loose bowel movements)    Constipation (not able to move bowels)    Fever    Tiredness    Changes in your liver function    Decreased appetite (decreased hunger)    Weight loss    Headache    Cough, trouble breathing    Rash   Note: Each of the side effects above was reported in 15% or greater of patients treated with bendamustine. Not all possible side effects are included above.   Warnings and Precautions    Severe bone marrow suppression and infections, which may be life-threatening.    Allergic reactions, including anaphylaxis are rare but may happen in some patients. Signs of allergic reaction to this drug may be swelling of the face, feeling like your tongue or throat are swelling, trouble breathing, rash, itching, fever, chills, feeling dizzy, and/or feeling that your heart is beating in a fast or not normal way. If this  happens, do not take another dose of this drug. You should get urgent medical treatment.      While you are getting this drug in your vein (IV), you may have a reaction to the drug. Sometimes you may be given medication to stop or lessen these side effects. Your nurse will check you closely for these signs: fever or shaking chills, flushing, facial swelling, feeling dizzy, headache, trouble breathing, rash, itching, chest tightness, or chest pain. These reactions may happen after your infusion. If this happens, call 911 for emergency care    Skin and tissue irritation may involve redness, pain, warmth, or swelling at the IV site. This happens if the drug leaks out of the vein and into nearby tissue.    Tumor lysis syndrome: This drug may act on the cancer cells very quickly. This may affect how your kidneys work.    Severe allergic skin reaction, which may be life-threatening. You may develop blisters on your skin that are filled with fluid or a severe red rash all over your body that may be painful.    Severe changes in your liver function, which may be life-threatening.    This drug may raise your risk of getting a second cancer such as leukemia.   Note: Some of the side effects above are very rare. If you have concerns and/or questions, please discuss them with your medical team.   Important Information  This drug may be present in the saliva, tears, sweat, urine, stool, vomit,   semen, and vaginal secretions. Talk to your doctor and/or your nurse about the necessary precautions to take during this time.   Treating Side Effects  Manage tiredness by pacing your activities for the day.    Be sure to include periods of rest between energy-draining activities.    To decrease the risk of infections, wash your hands regularly.    Avoid close contact with people who have a cold, the flu, or other infections.    Take your temperature as your doctor or nurse tells you, and whenever you feel like  you may have a fever.    To help decrease the risk of bleeding, use a soft toothbrush. Check with your nurse before using dental floss.    Be very careful when using knives or tools.    Use an electric shaver instead of a razor.    Drink plenty of fluids (a minimum of eight glasses per day is recommended).    Mouth care is very important. Your mouth care should consist of routine, gentle cleaning of your teeth or dentures and rinsing your mouth with a mixture of 1/2 teaspoon of salt in 8 ounces of water or 1/2 teaspoon of baking soda in 8 ounces of water. This should be done at least after each meal and at bedtime.    If you have mouth sores, avoid mouthwash that has alcohol. Also avoid alcohol and smoking because they can bother your mouth and throat.    To help with nausea and vomiting, eat small, frequent meals instead of three large meals a day. Choose foods and drinks that are at room temperature. Ask your nurse or doctor about other helpful tips and medicine that is available to help stop or lessen these symptoms.    If you throw up or have loose bowel movements, you should drink more fluids so that you do not become dehydrated (lack of water in the body from losing too much fluid).    If you have diarrhea, eat low-fiber foods that are high in protein and calories and avoid foods that can irritate your digestive tracts or lead to cramping.    If you are not able to move your bowels, check with your doctor or nurse before you use enemas, laxatives, or suppositories.    Ask your doctor or nurse about medicines that are available to help stop or lessen constipation and/or diarrhea.    Infusion reactions may happen after your infusion. If this happens, call 911 for emergency care.    To help with weight loss, drink fluids that contribute calories (whole milk, juice, soft drinks, sweetened beverages, milkshakes, and nutritional supplements) instead of water.    To help with decreased  appetite, eat small, frequent meals. Eat foods high in calories and protein, such as meat, poultry, fish, dry beans, tofu, eggs, nuts, milk, yogurt, cheese, ice cream, pudding, and nutritional supplements.    Consider using sauces and spices to increase taste. Daily exercise, with your doctor's approval, may increase your appetite.    Keeping your pain under control is important to your well-being. Please tell your doctor or nurse if you are experiencing pain.    If you get a rash do not put anything on it unless your doctor or nurse says you may. Keep the area around the rash clean and dry. Ask your doctor for medicine if your rash bothers you.   Food and Drug Interactions  There are no known interactions of bendamustine with food.      Check with your doctor or pharmacist about all other prescription medicines and over-the-counter medicines and dietary supplements (vitamins, minerals, herbs and others) you are taking before starting this medicine as there are known drug interactions with bendamustine. Also, check with your doctor or pharmacist before starting any new prescription or over-the-counter medicines, or dietary supplements to make sure that there are no interactions.   When to Call the Doctor Call your doctor or nurse if you have any of these symptoms and/or any new or unusual symptoms:    Fever of 100.4 F (38 C) or higher    Chills    Tiredness that interferes with your daily activities    Feeling dizzy or lightheaded    Coughing up yellow, green, or bloody mucus    Wheezing or trouble breathing    Headache that does not go away    Easy bleeding or bruising    No bowel movement in 3 days or when you feel uncomfortable.    Diarrhea, 4 times in one day or diarrhea with lack of strength or a feeling of being dizzy    Pain in your mouth or throat that makes it hard to eat or drink    Nausea that stops you from eating or drinking and/or is not relieved by prescribed  medicines    Throwing up    Lasting loss of appetite or rapid weight loss of five pounds in a week    Flu-like symptoms: fever, headache, muscle and joint aches, and fatigue (low energy, feeling weak)    A new rash or a rash that is not relieved by prescribed medicines    Signs of allergic reaction: swelling of the face, feeling like your tongue or throat are swelling, trouble breathing, rash, itching, fever, chills, feeling dizzy, and/or feeling that your heart is beating in a fast or not normal way. If this happens, call 911 for emergency care.    Signs of infusion reaction: fever or shaking chills, flushing, facial swelling, feeling dizzy, headache, trouble breathing, rash, itching, chest tightness, or chest pain. If this happens, call 911 for emergency care.    While you are getting this drug, please tell your nurse right away if you have any pain, redness, or swelling at the site of the IV infusion.    Signs of possible liver problems: dark urine, pale bowel movements, bad stomach pain, feeling very tired and weak, unusual itching, or yellowing of the eyes or skin    Signs of tumor lysis: confusion or agitation, decreased urine, nausea/vomiting, diarrhea, muscle cramping, numbness and/or tingling, seizures    If you think you may be pregnant, or may have impregnated your partner   Reproduction Warnings    Pregnancy warning: This drug can have harmful effects on the unborn baby. Women of childbearing potential should use effective methods of birth control during your cancer treatment and for at least 6 months after treatment. Men with male partners of childbearing potential should use effective methods of birth control during your cancer treatment and for at least 3 months after your cancer treatment. Let your doctor know right away if you think you may be pregnant or may have impregnated your partner.    Breastfeeding warning: It is not known if this drug passes into breast milk. For  this reason, women should not breastfeed during treatment and for at least 1 week after treatment because this drug could enter the breast milk and cause harm to a breastfeeding baby.    Fertility   warning: In men this drug may affect your ability to have children in the future. Talk with your doctor or nurse if you plan to have children. Ask for information on sperm banking.   SELF CARE ACTIVITIES WHILE ON CHEMOTHERAPY/IMMUNOTHERAPY:  Hydration Increase your fluid intake 48 hours prior to treatment and drink at least 8 to 12 cups (64 ounces) of water/decaffeinated beverages per day after treatment. You can still have your cup of coffee or soda but these beverages do not count as part of your 8 to 12 cups that you need to drink daily. No alcohol intake.  Medications Continue taking your normal prescription medication as prescribed.  If you start any new herbal or new supplements please let us know first to make sure it is safe.  Mouth Care Have teeth cleaned professionally before starting treatment. Keep dentures and partial plates clean. Use soft toothbrush and do not use mouthwashes that contain alcohol. Biotene is a good mouthwash that is available at most pharmacies or may be ordered by calling [phone]. Use warm salt water gargles (1 teaspoon salt per 1 quart warm water) before and after meals and at bedtime. Or you may rinse with 2 tablespoons of three-percent hydrogen peroxide mixed in eight ounces of water. If you are still having problems with your mouth or sores in your mouth please call the clinic. If you need dental work, please let the doctor know before you go for your appointment so that we can coordinate the best possible time for you in regards to your chemo regimen. You need to also let your dentist know that you are actively taking chemo. We may need to do labs prior to your dental appointment.  Skin Care Always use sunscreen that has not expired and with SPF (Sun Protection  Factor) of 50 or higher. Wear hats to protect your head from the sun. Remember to use sunscreen on your hands, ears, face, & feet.  Use good moisturizing lotions such as udder cream, eucerin, or even Vaseline. Some chemotherapies can cause dry skin, color changes in your skin and nails.    Avoid long, hot showers or baths. Use gentle, fragrance-free soaps and laundry detergent. Use moisturizers, preferably creams or ointments rather than lotions because the thicker consistency is better at preventing skin dehydration. Apply the cream or ointment within 15 minutes of showering. Reapply moisturizer at night, and moisturize your hands every time after you wash them.   Infection Prevention Please wash your hands for at least 30 seconds using warm soapy water. Handwashing is the #1 way to prevent the spread of germs. Stay away from sick people or people who are getting over a cold. If you develop respiratory systems such as green/yellow mucus production or productive cough or persistent cough let us know and we will see if you need an antibiotic. It is a good idea to keep a pair of gloves on when going into grocery stores/[employer] to decrease your risk of coming into contact with germs on the carts, etc. Carry alcohol hand gel with you at all times and use it frequently if out in public. If your temperature reaches 100.5 or higher please call the clinic and let us know.  If it is after hours or on the weekend please go to the ER if your temperature is over 100.4.  Please have your own personal thermometer at home to use.    Sex and bodily fluids If you are going to have sex, a condom must be   used to protect the person that isn't taking immunotherapy. For a few days after treatment, immunotherapy can be excreted through your bodily fluids.  When using the toilet please close the lid and flush the toilet twice.  Do this for a few day after you have had immunotherapy.   Contraception It is not known for sure  whether or not immunotherapy drugs can be passed on through semen or secretions from the vagina. Because of this some doctors advise people to use a barrier method if you have sex during treatment. This applies to vaginal, anal or oral sex.  Generally, doctors advise a barrier method only for the time you are actually having the treatment and for about a week after your treatment.  Advice like this can be worrying, but this does not mean that you have to avoid being intimate with your partner. You can still have close contact with your partner and continue to enjoy sex.  Animals If you have cats or birds we just ask that you not change the litter or change the cage.  Please have someone else do this for you while you are on immunotherapy.   Food Safety During and After Cancer Treatment Food safety is important for people both during and after cancer treatment. Cancer and cancer treatments, such as chemotherapy, radiation therapy, and stem cell/bone marrow transplantation, often weaken the immune system. This makes it harder for your body to protect itself from foodborne illness, also called food poisoning. Foodborne illness is caused by eating food that contains harmful bacteria, parasites, or viruses.  Foods to avoid Some foods have a higher risk of becoming tainted with bacteria. These include: Unwashed fresh fruit and vegetables, especially leafy vegetables that can hide dirt and other contaminants Raw sprouts, such as alfalfa sprouts Raw or undercooked beef, especially ground beef, or other raw or undercooked meat and poultry Fatty, fried, or spicy foods immediately before or after treatment.  These can sit heavy on your stomach and make you feel nauseous. Raw or undercooked shellfish, such as oysters. Sushi and sashimi, which often contain raw fish.  Unpasteurized beverages, such as unpasteurized fruit juices, raw milk, raw yogurt, or cider Undercooked eggs, such as soft boiled, over easy,  and poached; raw, unpasteurized eggs; or foods made with raw egg, such as homemade raw cookie dough and homemade mayonnaise  Simple steps for food safety  Shop smart. Do not buy food stored or displayed in an unclean area. Do not buy bruised or damaged fruits or vegetables. Do not buy cans that have cracks, dents, or bulges. Pick up foods that can spoil at the end of your shopping trip and store them in a cooler on the way home.  Prepare and clean up foods carefully. Rinse all fresh fruits and vegetables under running water, and dry them with a clean towel or paper towel. Clean the top of cans before opening them. After preparing food, wash your hands for 20 seconds with hot water and soap. Pay special attention to areas between fingers and under nails. Clean your utensils and dishes with hot water and soap. Disinfect your kitchen and cutting boards using 1 teaspoon of liquid, unscented bleach mixed into 1 quart of water.    Dispose of old food. Eat canned and packaged food before its expiration date (the "use by" or "best before" date). Consume refrigerated leftovers within 3 to 4 days. After that time, throw out the food. Even if the food does not smell or look spoiled, it   still may be unsafe. Some bacteria, such as Listeria, can grow even on foods stored in the refrigerator if they are kept for too long.  Take precautions when eating out. At restaurants, avoid buffets and salad bars where food sits out for a long time and comes in contact with many people. Food can become contaminated when someone with a virus, often a norovirus, or another bug handles it. Put any leftover food in a to-go container yourself, rather than having the server do it. And, refrigerate leftovers as soon as you get home. Choose restaurants that are clean and that are willing to prepare your food as you order it cooked.    SYMPTOMS TO REPORT AS SOON AS POSSIBLE AFTER TREATMENT:  FEVER GREATER THAN 100.4  F CHILLS WITH OR WITHOUT FEVER NAUSEA AND VOMITING THAT IS NOT CONTROLLED WITH YOUR NAUSEA MEDICATION UNUSUAL SHORTNESS OF BREATH UNUSUAL BRUISING OR BLEEDING TENDERNESS IN MOUTH AND THROAT WITH OR WITHOUT PRESENCE OF ULCERS URINARY PROBLEMS BOWEL PROBLEMS UNUSUAL RASH     Wear comfortable clothing and clothing appropriate for easy access to any Portacath or PICC line. Let us know if there is anything that we can do to make your therapy better!   What to do if you need assistance after hours or on the weekends: CALL [phone].  HOLD on the line, do not hang up.  You will hear multiple messages but at the end you will be connected with a nurse triage line.  They will contact the doctor if necessary.  Most of the time they will be able to assist you.  Do not call the hospital operator.    I have been informed and understand all of the instructions given to me and have received a copy. I have been instructed to call the clinic [phone] or my family physician as soon as possible for continued medical care, if indicated. I do not have any more questions at this time but understand that I may call the [facility] or the Patient Navigator at [phone] during office hours should I have questions or need assistance in obtaining follow-up care.

## 2021-07-31 NOTE — Telephone Encounter (Signed)
[place] Work  Clinical Social Work was referred by medical oncology for assessment of psychosocial needs.  Clinical Social Worker  attempted to contact patient by phone   to offer support and assess for needs.    No answer. Left VM with direct contact information.      [place], [place] Worker [employer]

## 2021-08-05 ENCOUNTER — Other Ambulatory Visit: Payer: Self-pay

## 2021-08-05 ENCOUNTER — Inpatient Hospital Stay (HOSPITAL_COMMUNITY): Payer: No Typology Code available for payment source

## 2021-08-05 VITALS — BP 106/56 | HR 80 | Temp 98.2°F | Resp 18 | Ht 66.0 in | Wt 161.4 lb

## 2021-08-05 DIAGNOSIS — D509 Iron deficiency anemia, unspecified: Secondary | ICD-10-CM

## 2021-08-05 DIAGNOSIS — Z5112 Encounter for antineoplastic immunotherapy: Secondary | ICD-10-CM | POA: Diagnosis not present

## 2021-08-05 MED ORDER — SODIUM CHLORIDE 0.9% FLUSH
10.0000 mL | Freq: Once | INTRAVENOUS | Status: AC | PRN
  Administered 2021-08-05: 09:00:00 10 mL

## 2021-08-05 MED ORDER — SODIUM CHLORIDE 0.9 % IV SOLN: Freq: Once | INTRAVENOUS | Status: AC

## 2021-08-05 MED ORDER — SODIUM CHLORIDE 0.9 % IV SOLN
510.0000 mg | Freq: Once | INTRAVENOUS | Status: AC
  Administered 2021-08-05: 510 mg via INTRAVENOUS
  Filled 2021-08-05: qty 510

## 2021-08-05 MED ORDER — HEPARIN SOD (PORK) LOCK FLUSH 100 UNIT/ML IV SOLN
500.0000 [IU] | Freq: Once | INTRAVENOUS | Status: AC | PRN
  Administered 2021-08-05: 09:00:00 500 [IU]

## 2021-08-05 NOTE — Progress Notes (Signed)
Patient presents today for Feraheme infusion per providers order.  Vital signs WNL.  Patient has no new complaints at this time.    Feraheme infusion given today per MD orders.  Stable during infusion without adverse affects.  Vital signs stable.  No complaints at this time.  Discharge from clinic ambulatory in stable condition.  Alert and oriented X 3.  Follow up with [facility] as scheduled.

## 2021-08-05 NOTE — Patient Instructions (Signed)
[facility]  Discharge Instructions: °Thank you for choosing [facility] to provide your oncology and hematology care.  °If you have a lab appointment with the Cancer Center, please come in thru the Main Entrance and check in at the main information desk. ° °Wear comfortable clothing and clothing appropriate for easy access to any Portacath or PICC line.  ° °We strive to give you quality time with your provider. You may need to reschedule your appointment if you arrive late (15 or more minutes).  Arriving late affects you and other patients whose appointments are after yours.  Also, if you miss three or more appointments without notifying the office, you may be dismissed from the clinic at the provider’s discretion.    °  °For prescription refill requests, have your pharmacy contact our office and allow 72 hours for refills to be completed.   ° °Today you received the following chemotherapy and/or immunotherapy agents Feraheme    °  °To help prevent nausea and vomiting after your treatment, we encourage you to take your nausea medication as directed. ° °BELOW ARE SYMPTOMS THAT SHOULD BE REPORTED IMMEDIATELY: °*FEVER GREATER THAN 100.4 F (38 °C) OR HIGHER °*CHILLS OR SWEATING °*NAUSEA AND VOMITING THAT IS NOT CONTROLLED WITH YOUR NAUSEA MEDICATION °*UNUSUAL SHORTNESS OF BREATH °*UNUSUAL BRUISING OR BLEEDING °*URINARY PROBLEMS (pain or burning when urinating, or frequent urination) °*BOWEL PROBLEMS (unusual diarrhea, constipation, pain near the anus) °TENDERNESS IN MOUTH AND THROAT WITH OR WITHOUT PRESENCE OF ULCERS (sore throat, sores in mouth, or a toothache) °UNUSUAL RASH, SWELLING OR PAIN  °UNUSUAL VAGINAL DISCHARGE OR ITCHING  ° °Items with * indicate a potential emergency and should be followed up as soon as possible or go to the Emergency Department if any problems should occur. ° °Please show the CHEMOTHERAPY ALERT CARD or IMMUNOTHERAPY ALERT CARD at check-in to the Emergency  Department and triage nurse. ° °Should you have questions after your visit or need to cancel or reschedule your appointment, please contact [facility] [phone]  and follow the prompts.  Office hours are 8:00 a.m. to 4:30 p.m. Monday - Friday. Please note that voicemails left after 4:00 p.m. may not be returned until the following business day.  We are closed weekends and major holidays. You have access to a nurse at all times for urgent questions. Please call the main number to the clinic [phone] and follow the prompts. ° °For any non-urgent questions, you may also contact your provider using MyChart. We now offer e-Visits for anyone 18 and older to request care online for non-urgent symptoms. For details visit [email or web address]. °  °Also download the MyChart app! Go to the app store, search "MyChart", open the app, select [facility], and log in with your MyChart username and password. ° °Due to Covid, a mask is required upon entering the hospital/clinic. If you do not have a mask, one will be given to you upon arrival. For doctor visits, patients may have 1 support person aged 18 or older with them. For treatment visits, patients cannot have anyone with them due to current Covid guidelines and our immunocompromised population.  °

## 2021-08-06 ENCOUNTER — Inpatient Hospital Stay (HOSPITAL_COMMUNITY): Payer: No Typology Code available for payment source

## 2021-08-06 NOTE — Progress Notes (Signed)

## 2021-08-10 ENCOUNTER — Other Ambulatory Visit: Payer: Self-pay

## 2021-08-10 ENCOUNTER — Inpatient Hospital Stay (HOSPITAL_COMMUNITY): Payer: No Typology Code available for payment source

## 2021-08-10 VITALS — BP 112/51 | HR 102 | Temp 98.7°F | Resp 18 | Ht 66.0 in | Wt 162.2 lb

## 2021-08-10 DIAGNOSIS — Z5112 Encounter for antineoplastic immunotherapy: Secondary | ICD-10-CM | POA: Diagnosis not present

## 2021-08-10 DIAGNOSIS — C8318 Mantle cell lymphoma, lymph nodes of multiple sites: Secondary | ICD-10-CM

## 2021-08-10 LAB — COMPREHENSIVE METABOLIC PANEL
ALT: 14 U/L (ref 0–44)
AST: 15 U/L (ref 15–41)
Albumin: 3.6 g/dL (ref 3.5–5.0)
Alkaline Phosphatase: 88 U/L (ref 38–126)
Anion gap: 5 (ref 5–15)
BUN: 14 mg/dL (ref 8–23)
CO2: 25 mmol/L (ref 22–32)
Calcium: 9.2 mg/dL (ref 8.9–10.3)
Chloride: 102 mmol/L (ref 98–111)
Creatinine, Ser: 1.29 mg/dL — ABNORMAL HIGH (ref 0.61–1.24)
GFR, Estimated: 59 mL/min — ABNORMAL LOW (ref >60)
Glucose, Bld: 139 mg/dL — ABNORMAL HIGH (ref 70–99)
Potassium: 4.1 mmol/L (ref 3.5–5.1)
Sodium: 132 mmol/L — ABNORMAL LOW (ref 135–145)
Total Bilirubin: 0.5 mg/dL (ref 0.3–1.2)
Total Protein: 8 g/dL (ref 6.5–8.1)

## 2021-08-10 LAB — CBC WITH DIFFERENTIAL/PLATELET
Abs Immature Granulocytes: 0.19 10*3/uL — ABNORMAL HIGH (ref 0.00–0.07)
Basophils Absolute: 0.1 10*3/uL (ref 0.0–0.1)
Basophils Relative: 0 %
Eosinophils Absolute: 0.3 10*3/uL (ref 0.0–0.5)
Eosinophils Relative: 0 %
HCT: 33.7 % — ABNORMAL LOW (ref 39.0–52.0)
Hemoglobin: 10 g/dL — ABNORMAL LOW (ref 13.0–17.0)
Immature Granulocytes: 0 %
Lymphocytes Relative: 91 %
Lymphs Abs: 65.7 10*3/uL — ABNORMAL HIGH (ref 0.7–4.0)
MCH: 23.4 pg — ABNORMAL LOW (ref 26.0–34.0)
MCHC: 29.7 g/dL — ABNORMAL LOW (ref 30.0–36.0)
MCV: 78.9 fL — ABNORMAL LOW (ref 80.0–100.0)
Monocytes Absolute: 1.9 10*3/uL — ABNORMAL HIGH (ref 0.1–1.0)
Monocytes Relative: 3 %
Neutro Abs: 4.6 10*3/uL (ref 1.7–7.7)
Neutrophils Relative %: 6 %
Platelets: 84 10*3/uL — ABNORMAL LOW (ref 150–400)
RBC: 4.27 MIL/uL (ref 4.22–5.81)
RDW: 19.2 % — ABNORMAL HIGH (ref 11.5–15.5)
WBC: 72.7 10*3/uL (ref 4.0–10.5)
nRBC: 0 % (ref 0.0–0.2)

## 2021-08-10 LAB — MAGNESIUM: Magnesium: 2.1 mg/dL (ref 1.7–2.4)

## 2021-08-10 LAB — URIC ACID: Uric Acid, Serum: 4.3 mg/dL (ref 3.7–8.6)

## 2021-08-10 LAB — LACTATE DEHYDROGENASE: LDH: 142 U/L (ref 98–192)

## 2021-08-10 MED ORDER — SODIUM CHLORIDE 0.9 % IV SOLN: Freq: Once | INTRAVENOUS | Status: AC

## 2021-08-10 MED ORDER — SODIUM CHLORIDE 0.9 % IV SOLN
72.0000 mg/m2 | Freq: Once | INTRAVENOUS | Status: AC
  Administered 2021-08-10: 125 mg via INTRAVENOUS
  Filled 2021-08-10: qty 5

## 2021-08-10 MED ORDER — MEPERIDINE HCL 50 MG/ML IJ SOLN
25.0000 mg | Freq: Once | INTRAMUSCULAR | Status: AC
  Administered 2021-08-10: 25 mg via INTRAVENOUS
  Filled 2021-08-10: qty 1

## 2021-08-10 MED ORDER — SODIUM CHLORIDE 0.9 % IV SOLN
6.0000 mg | Freq: Once | INTRAVENOUS | Status: AC
  Administered 2021-08-10: 6 mg via INTRAVENOUS
  Filled 2021-08-10: qty 4

## 2021-08-10 MED ORDER — HEPARIN SOD (PORK) LOCK FLUSH 100 UNIT/ML IV SOLN
500.0000 [IU] | Freq: Once | INTRAVENOUS | Status: AC | PRN
  Administered 2021-08-10: 500 [IU]

## 2021-08-10 MED ORDER — PALONOSETRON HCL INJECTION 0.25 MG/5ML
0.2500 mg | Freq: Once | INTRAVENOUS | Status: AC
  Administered 2021-08-10: 0.25 mg via INTRAVENOUS
  Filled 2021-08-10: qty 5

## 2021-08-10 MED ORDER — ACETAMINOPHEN 325 MG PO TABS
650.0000 mg | ORAL_TABLET | Freq: Once | ORAL | Status: AC
  Administered 2021-08-10: 650 mg via ORAL
  Filled 2021-08-10: qty 2

## 2021-08-10 MED ORDER — SODIUM CHLORIDE 0.9% FLUSH
10.0000 mL | INTRAVENOUS | Status: DC | PRN
  Administered 2021-08-10: 10 mL

## 2021-08-10 MED ORDER — SODIUM CHLORIDE 0.9 % IV SOLN
90.0000 mg/m2 | Freq: Once | INTRAVENOUS | Status: DC
  Filled 2021-08-10: qty 7

## 2021-08-10 MED ORDER — DIPHENHYDRAMINE HCL 25 MG PO CAPS
50.0000 mg | ORAL_CAPSULE | Freq: Once | ORAL | Status: AC
  Administered 2021-08-10: 50 mg via ORAL
  Filled 2021-08-10: qty 2

## 2021-08-10 MED ORDER — SODIUM CHLORIDE 0.9 % IV SOLN
375.0000 mg/m2 | Freq: Once | INTRAVENOUS | Status: AC
  Administered 2021-08-10: 700 mg via INTRAVENOUS
  Filled 2021-08-10: qty 50

## 2021-08-10 MED ORDER — SODIUM CHLORIDE 0.9 % IV SOLN
10.0000 mg | Freq: Once | INTRAVENOUS | Status: AC
  Administered 2021-08-10: 10 mg via INTRAVENOUS
  Filled 2021-08-10: qty 10

## 2021-08-10 NOTE — Progress Notes (Signed)
Pt presents today for C1D1 Rituximab/Bendeka provider's order. Vital signs and other labs WNL for treamtent today.WBC 72.7 MD made aware. Okay to proceed with treatment per Dr.K/A.Anderson-RN.  [place] entered the room to check on pt, and he stated he had an headache with rigors obtained vitals see flowsheet. Charge nurse called into the room  1254  Rituximab was stopped. Charge nurse called Dr.K and he stated to give pt Demerol 0.5mg  x 1 dose and restarted Rituximab in 15 minutes.  1314 Demerol 0.5 mg x 1 dose was given and vitals obtained at 1331 see flowsheet. Pt re-evaluated denies headache and rigors.  1336 Ritximab restarted at 150mg  dose per Dr.K   C1D1 Rituximab/Bendeka given today per MD orders. Tolerated infusion without adverse affects. Vital signs stable. No complaints at this time. Discharged from clinic ambulatory in stable condition. Alert and oriented x 3. F/U with [facility] as scheduled.

## 2021-08-10 NOTE — Progress Notes (Signed)
CRITICAL VALUE ALERT Critical value received:  WBC 72.7 Date of notification:  08-10-2021 Time of notification: 08:44 am  Critical value read back:  Yes.   Nurse who received alert:  B. Georgianna Band RN MD notified time and response:  Dr. Delton Coombes / A. Beckie Salts, M. [place] Therapist, sports.08:51 am.      12:56 pm Verbal order received to give patient 25 mgs of Demerol x 1 dose now. Re-evaluate rigors after medication and if patient okay may restart back at the 150mg  dose per Dr. Delton Coombes.

## 2021-08-10 NOTE — Progress Notes (Signed)
Dr Delton Coombes wants lower dose of Bendeka today at 72 mg/m2.  T.O. Dr Rhys Martini, PharmD

## 2021-08-10 NOTE — Patient Instructions (Signed)
[facility]  Discharge Instructions: Thank you for choosing [facility] to provide your oncology and hematology care.  If you have a lab appointment with the [facility], please come in thru the Main Entrance and check in at the main information desk.  Wear comfortable clothing and clothing appropriate for easy access to any Portacath or PICC line.   We strive to give you quality time with your provider. You may need to reschedule your appointment if you arrive late (15 or more minutes).  Arriving late affects you and other patients whose appointments are after yours.  Also, if you miss three or more appointments without notifying the office, you may be dismissed from the clinic at the providers discretion.      For prescription refill requests, have your pharmacy contact our office and allow 72 hours for refills to be completed.    Today you received the following chemotherapy and/or immunotherapy agents Rituximab and Bendeka.   To help prevent nausea and vomiting after your treatment, we encourage you to take your nausea medication as directed.  BELOW ARE SYMPTOMS THAT SHOULD BE REPORTED IMMEDIATELY: *FEVER GREATER THAN 100.4 F (38 C) OR HIGHER *CHILLS OR SWEATING *NAUSEA AND VOMITING THAT IS NOT CONTROLLED WITH YOUR NAUSEA MEDICATION *UNUSUAL SHORTNESS OF BREATH *UNUSUAL BRUISING OR BLEEDING *URINARY PROBLEMS (pain or burning when urinating, or frequent urination) *BOWEL PROBLEMS (unusual diarrhea, constipation, pain near the anus) TENDERNESS IN MOUTH AND THROAT WITH OR WITHOUT PRESENCE OF ULCERS (sore throat, sores in mouth, or a toothache) UNUSUAL RASH, SWELLING OR PAIN  UNUSUAL VAGINAL DISCHARGE OR ITCHING   Items with * indicate a potential emergency and should be followed up as soon as possible or go to the Emergency Department if any problems should occur.  Please show the CHEMOTHERAPY ALERT CARD or IMMUNOTHERAPY ALERT CARD at check-in to the  Emergency Department and triage nurse.  Should you have questions after your visit or need to cancel or reschedule your appointment, please contact [facility] [phone]  and follow the prompts.  Office hours are 8:00 a.m. to 4:30 p.m. Monday - Friday. Please note that voicemails left after 4:00 p.m. may not be returned until the following business day.  We are closed weekends and major holidays. You have access to a nurse at all times for urgent questions. Please call the main number to the clinic [phone] and follow the prompts.  For any non-urgent questions, you may also contact your provider using MyChart. We now offer e-Visits for anyone 40 and older to request care online for non-urgent symptoms. For details visit [email or web address].   Also download the MyChart app! Go to the app store, search "MyChart", open the app, select [facility], and log in with your MyChart username and password.  Due to Covid, a mask is required upon entering the hospital/clinic. If you do not have a mask, one will be given to you upon arrival. For doctor visits, patients may have 1 support person aged 66 or older with them. For treatment visits, patients cannot have anyone with them due to current Covid guidelines and our immunocompromised population.

## 2021-08-11 ENCOUNTER — Inpatient Hospital Stay (HOSPITAL_COMMUNITY): Payer: No Typology Code available for payment source

## 2021-08-11 ENCOUNTER — Ambulatory Visit (HOSPITAL_COMMUNITY): Payer: No Typology Code available for payment source

## 2021-08-11 VITALS — BP 103/54 | HR 56 | Temp 96.7°F | Resp 18 | Ht 66.0 in

## 2021-08-11 DIAGNOSIS — Z5112 Encounter for antineoplastic immunotherapy: Secondary | ICD-10-CM | POA: Diagnosis not present

## 2021-08-11 DIAGNOSIS — D509 Iron deficiency anemia, unspecified: Secondary | ICD-10-CM

## 2021-08-11 DIAGNOSIS — C8318 Mantle cell lymphoma, lymph nodes of multiple sites: Secondary | ICD-10-CM

## 2021-08-11 MED ORDER — SODIUM CHLORIDE 0.9% FLUSH
10.0000 mL | INTRAVENOUS | Status: DC | PRN
  Administered 2021-08-11: 10 mL

## 2021-08-11 MED ORDER — SODIUM CHLORIDE 0.9 % IV SOLN
72.0000 mg/m2 | Freq: Once | INTRAVENOUS | Status: AC
  Administered 2021-08-11: 125 mg via INTRAVENOUS
  Filled 2021-08-11: qty 5

## 2021-08-11 MED ORDER — SODIUM CHLORIDE 0.9 % IV SOLN: Freq: Once | INTRAVENOUS | Status: AC

## 2021-08-11 MED ORDER — SODIUM CHLORIDE 0.9 % IV SOLN
10.0000 mg | Freq: Once | INTRAVENOUS | Status: AC
  Administered 2021-08-11: 10 mg via INTRAVENOUS
  Filled 2021-08-11: qty 10

## 2021-08-11 MED ORDER — SODIUM CHLORIDE 0.9 % IV SOLN
510.0000 mg | Freq: Once | INTRAVENOUS | Status: AC
  Administered 2021-08-11: 510 mg via INTRAVENOUS
  Filled 2021-08-11: qty 510

## 2021-08-11 MED ORDER — HEPARIN SOD (PORK) LOCK FLUSH 100 UNIT/ML IV SOLN
500.0000 [IU] | Freq: Once | INTRAVENOUS | Status: AC | PRN
  Administered 2021-08-11: 500 [IU]

## 2021-08-11 NOTE — Progress Notes (Signed)
Continue Bendeka dose at 72 mg/m2 for subsequent cycles.  T.O. Dr Rhys Martini, PharmD

## 2021-08-11 NOTE — Progress Notes (Signed)
Pt presents today for Bendeka and Feraheme IV iron infusion per provider's order. Vital signs stable and pt voiced no new complaints at this time.Okay to proceed with treatment today.  Verl Dicker and Feraheme  given today per MD orders. Tolerated infusion without adverse affects. Vital signs stable. No complaints at this time. Discharged from clinic ambulatory in stable condition. Alert and oriented x 3. F/U with [facility] as scheduled.

## 2021-08-11 NOTE — Patient Instructions (Signed)
[facility]  Discharge Instructions: Thank you for choosing [facility] to provide your oncology and hematology care.  If you have a lab appointment with the [facility], please come in thru the Main Entrance and check in at the main information desk.  Wear comfortable clothing and clothing appropriate for easy access to any Portacath or PICC line.   We strive to give you quality time with your provider. You may need to reschedule your appointment if you arrive late (15 or more minutes).  Arriving late affects you and other patients whose appointments are after yours.  Also, if you miss three or more appointments without notifying the office, you may be dismissed from the clinic at the providers discretion.      For prescription refill requests, have your pharmacy contact our office and allow 72 hours for refills to be completed.    Today you received the following chemotherapy and/or immunotherapy agents Bendeka and Feraheme IV iron.   To help prevent nausea and vomiting after your treatment, we encourage you to take your nausea medication as directed.  BELOW ARE SYMPTOMS THAT SHOULD BE REPORTED IMMEDIATELY: *FEVER GREATER THAN 100.4 F (38 C) OR HIGHER *CHILLS OR SWEATING *NAUSEA AND VOMITING THAT IS NOT CONTROLLED WITH YOUR NAUSEA MEDICATION *UNUSUAL SHORTNESS OF BREATH *UNUSUAL BRUISING OR BLEEDING *URINARY PROBLEMS (pain or burning when urinating, or frequent urination) *BOWEL PROBLEMS (unusual diarrhea, constipation, pain near the anus) TENDERNESS IN MOUTH AND THROAT WITH OR WITHOUT PRESENCE OF ULCERS (sore throat, sores in mouth, or a toothache) UNUSUAL RASH, SWELLING OR PAIN  UNUSUAL VAGINAL DISCHARGE OR ITCHING   Items with * indicate a potential emergency and should be followed up as soon as possible or go to the Emergency Department if any problems should occur.  Please show the CHEMOTHERAPY ALERT CARD or IMMUNOTHERAPY ALERT CARD at check-in to the  Emergency Department and triage nurse.  Should you have questions after your visit or need to cancel or reschedule your appointment, please contact [facility] [phone]  and follow the prompts.  Office hours are 8:00 a.m. to 4:30 p.m. Monday - Friday. Please note that voicemails left after 4:00 p.m. may not be returned until the following business day.  We are closed weekends and major holidays. You have access to a nurse at all times for urgent questions. Please call the main number to the clinic [phone] and follow the prompts.  For any non-urgent questions, you may also contact your provider using MyChart. We now offer e-Visits for anyone 69 and older to request care online for non-urgent symptoms. For details visit [email or web address].   Also download the MyChart app! Go to the app store, search "MyChart", open the app, select [facility], and log in with your MyChart username and password.  Due to Covid, a mask is required upon entering the hospital/clinic. If you do not have a mask, one will be given to you upon arrival. For doctor visits, patients may have 1 support person aged 64 or older with them. For treatment visits, patients cannot have anyone with them due to current Covid guidelines and our immunocompromised population.

## 2021-08-12 ENCOUNTER — Ambulatory Visit (HOSPITAL_COMMUNITY): Payer: No Typology Code available for payment source

## 2021-08-12 ENCOUNTER — Telehealth (HOSPITAL_COMMUNITY): Payer: Self-pay

## 2021-08-12 NOTE — Telephone Encounter (Signed)
Chemotherapy 24 hour follow up.  Spoke with the patient with no complaints verbalized.  Denied pain, rash, or SOB.  Reviewed when to call the cancer center with understanding verbalized.

## 2021-08-25 ENCOUNTER — Inpatient Hospital Stay (HOSPITAL_COMMUNITY): Payer: No Typology Code available for payment source | Attending: Hematology

## 2021-08-25 ENCOUNTER — Encounter (HOSPITAL_COMMUNITY): Payer: Self-pay | Admitting: Hematology

## 2021-08-25 ENCOUNTER — Inpatient Hospital Stay (HOSPITAL_BASED_OUTPATIENT_CLINIC_OR_DEPARTMENT_OTHER): Payer: No Typology Code available for payment source | Admitting: Hematology

## 2021-08-25 ENCOUNTER — Other Ambulatory Visit: Payer: Self-pay

## 2021-08-25 VITALS — BP 132/67 | HR 80 | Temp 96.9°F | Resp 18 | Ht 66.0 in | Wt 166.2 lb

## 2021-08-25 DIAGNOSIS — N189 Chronic kidney disease, unspecified: Secondary | ICD-10-CM | POA: Insufficient documentation

## 2021-08-25 DIAGNOSIS — D631 Anemia in chronic kidney disease: Secondary | ICD-10-CM | POA: Diagnosis not present

## 2021-08-25 DIAGNOSIS — C8314 Mantle cell lymphoma, lymph nodes of axilla and upper limb: Secondary | ICD-10-CM | POA: Diagnosis not present

## 2021-08-25 DIAGNOSIS — C8318 Mantle cell lymphoma, lymph nodes of multiple sites: Secondary | ICD-10-CM

## 2021-08-25 DIAGNOSIS — D509 Iron deficiency anemia, unspecified: Secondary | ICD-10-CM | POA: Diagnosis not present

## 2021-08-25 LAB — COMPREHENSIVE METABOLIC PANEL
ALT: 26 U/L (ref 0–44)
AST: 20 U/L (ref 15–41)
Albumin: 3.7 g/dL (ref 3.5–5.0)
Alkaline Phosphatase: 73 U/L (ref 38–126)
Anion gap: 10 (ref 5–15)
BUN: 12 mg/dL (ref 8–23)
CO2: 22 mmol/L (ref 22–32)
Calcium: 9.3 mg/dL (ref 8.9–10.3)
Chloride: 103 mmol/L (ref 98–111)
Creatinine, Ser: 1.06 mg/dL (ref 0.61–1.24)
GFR, Estimated: >60 mL/min (ref >60)
Glucose, Bld: 156 mg/dL — ABNORMAL HIGH (ref 70–99)
Potassium: 4.1 mmol/L (ref 3.5–5.1)
Sodium: 135 mmol/L (ref 135–145)
Total Bilirubin: 0.5 mg/dL (ref 0.3–1.2)
Total Protein: 7.3 g/dL (ref 6.5–8.1)

## 2021-08-25 LAB — LACTATE DEHYDROGENASE: LDH: 123 U/L (ref 98–192)

## 2021-08-25 LAB — URIC ACID: Uric Acid, Serum: 3.6 mg/dL — ABNORMAL LOW (ref 3.7–8.6)

## 2021-08-25 LAB — CBC WITH DIFFERENTIAL/PLATELET
Abs Immature Granulocytes: 0.02 10*3/uL (ref 0.00–0.07)
Basophils Absolute: 0 10*3/uL (ref 0.0–0.1)
Basophils Relative: 1 %
Eosinophils Absolute: 0.1 10*3/uL (ref 0.0–0.5)
Eosinophils Relative: 3 %
HCT: 38.7 % — ABNORMAL LOW (ref 39.0–52.0)
Hemoglobin: 12 g/dL — ABNORMAL LOW (ref 13.0–17.0)
Immature Granulocytes: 0 %
Lymphocytes Relative: 17 %
Lymphs Abs: 0.8 10*3/uL (ref 0.7–4.0)
MCH: 24.8 pg — ABNORMAL LOW (ref 26.0–34.0)
MCHC: 31 g/dL (ref 30.0–36.0)
MCV: 80.1 fL (ref 80.0–100.0)
Monocytes Absolute: 0.4 10*3/uL (ref 0.1–1.0)
Monocytes Relative: 8 %
Neutro Abs: 3.4 10*3/uL (ref 1.7–7.7)
Neutrophils Relative %: 71 %
Platelets: 87 10*3/uL — ABNORMAL LOW (ref 150–400)
RBC: 4.83 MIL/uL (ref 4.22–5.81)
RDW: 20.7 % — ABNORMAL HIGH (ref 11.5–15.5)
WBC: 4.8 10*3/uL (ref 4.0–10.5)
nRBC: 0 % (ref 0.0–0.2)

## 2021-08-25 LAB — MAGNESIUM: Magnesium: 1.6 mg/dL — ABNORMAL LOW (ref 1.7–2.4)

## 2021-08-25 MED ORDER — MAGNESIUM OXIDE -MG SUPPLEMENT 400 (240 MG) MG PO TABS: 400.0000 mg | ORAL_TABLET | Freq: Two times a day (BID) | ORAL | 3 refills | Status: AC

## 2021-08-25 MED ORDER — SODIUM CHLORIDE 0.9% FLUSH
10.0000 mL | Freq: Once | INTRAVENOUS | Status: AC
  Administered 2021-08-25: 10 mL via INTRAVENOUS

## 2021-08-25 MED ORDER — HEPARIN SOD (PORK) LOCK FLUSH 100 UNIT/ML IV SOLN
500.0000 [IU] | Freq: Once | INTRAVENOUS | Status: AC
Start: 2021-08-25 — End: 2021-08-25
  Administered 2021-08-25: 500 [IU] via INTRAVENOUS

## 2021-08-25 NOTE — Patient Instructions (Addendum)
[place] at [facility] Discharge Instructions   You were seen and examined today by Dr. Delton Coombes.  He reviewed your lab work which is normal/stable.  Return as scheduled for lab work, office visit, and treatment.      Thank you for choosing [place] at [facility] to provide your oncology and hematology care.  To afford each patient quality time with our provider, please arrive at least 15 minutes before your scheduled appointment time.   If you have a lab appointment with the [facility] please come in thru the Main Entrance and check in at the main information desk.  You need to re-schedule your appointment should you arrive 10 or more minutes late.  We strive to give you quality time with our providers, and arriving late affects you and other patients whose appointments are after yours.  Also, if you no show three or more times for appointments you may be dismissed from the clinic at the providers discretion.     Again, thank you for choosing [facility].  Our hope is that these requests will decrease the amount of time that you wait before being seen by our physicians.       _____________________________________________________________  Should you have questions after your visit to [facility], please contact our office at [phone] and follow the prompts.  Our office hours are 8:00 a.m. and 4:30 p.m. Monday - Friday.  Please note that voicemails left after 4:00 p.m. may not be returned until the following business day.  We are closed weekends and major holidays.  You do have access to a nurse 24-7, just call the main number to the clinic [phone] and do not press any options, hold on the line and a nurse will answer the phone.    For prescription refill requests, have your pharmacy contact our office and allow 72 hours.    Due to Covid, you will need to wear a mask upon entering the hospital. If  you do not have a mask, a mask will be given to you at the Main Entrance upon arrival. For doctor visits, patients may have 1 support person age 14 or older with them. For treatment visits, patients can not have anyone with them due to social distancing guidelines and our immunocompromised population.

## 2021-08-25 NOTE — Progress Notes (Signed)
Nicholas Lawrence, [place] [address]   CLINIC:  Medical Oncology/Hematology  PCP:  [facility], [facility] [place] [address] [phone]   REASON FOR VISIT:  Follow-up for mantle cell lymphoma  PRIOR THERAPY: none  NGS Results: not done  CURRENT THERAPY: Rituximab D1 + Bendamustine D1,2 q28d x 6 cycles   BRIEF ONCOLOGIC HISTORY:  Oncology History  Mantle cell lymphoma ([place])  06/09/2021 Initial Diagnosis   Mantle cell lymphoma ([place])   08/10/2021 -  Chemotherapy   Patient is on Treatment Plan : NON-HODGKINS LYMPHOMA Rituximab D1 + Bendamustine D1,2 q28d x 6 cycles       CANCER STAGING:  Cancer Staging  Mantle cell lymphoma ([place]) Staging form: Hodgkin and Non-Hodgkin Lymphoma, AJCC 8th Edition - Clinical stage from 06/09/2021: Stage III - Unsigned   INTERVAL HISTORY:  [name], a 71 y.o. male, returns for routine follow-up of his mantle cell lymphoma. [name] was last seen on 06/23/2021.   Today he reports feeling good. He reports fatigue. He is scheduled for a colonoscopy on 02/27. He reports mild chills. He continues to take allopurinol and acyclovir.   REVIEW OF SYSTEMS:  Review of Systems  Constitutional:  Positive for fatigue. Negative for appetite change, chills and unexpected weight change.  Respiratory:  Positive for shortness of breath.   All other systems reviewed and are negative.  PAST MEDICAL/SURGICAL HISTORY:  Past Medical History:  Diagnosis Date   BPH (benign prostatic hyperplasia)    Diabetes mellitus without complication ([place])    Hypercholesteremia    Sleep apnea    Past Surgical History:  Procedure Laterality Date   AXILLARY LYMPH NODE BIOPSY Left 07/20/2021   Procedure: AXILLARY LYMPH NODE BIOPSY;  Surgeon: Aviva Signs, MD;  Location: [facility];  Service: General;  Laterality: Left;   IR IMAGING GUIDED PORT INSERTION  06/22/2021   KIDNEY STONE SURGERY     PROSTATECTOMY       SOCIAL HISTORY:  Social History   Socioeconomic History   Marital status: Single    Spouse name: Not on file   Number of children: Not on file   Years of education: Not on file   Highest education level: Not on file  Occupational History   Not on file  Tobacco Use   Smoking status: Never   Smokeless tobacco: Never  Vaping Use   Vaping Use: Never used  Substance and Sexual Activity   Alcohol use: Not Currently   Drug use: Not Currently   Sexual activity: Not Currently  Other Topics Concern   Not on file  Social History Narrative   Not on file   Social Determinants of Health   Financial Resource Strain: Not on file  Food Insecurity: Not on file  Transportation Needs: Not on file  Physical Activity: Not on file  Stress: Not on file  Social Connections: Not on file  Intimate Partner Violence: Not on file    FAMILY HISTORY:  Family History  Family history unknown: Yes    CURRENT MEDICATIONS:  Current Outpatient Medications  Medication Sig Dispense Refill   acyclovir (ZOVIRAX) 400 MG tablet Take 1 tablet (400 mg total) by mouth daily. 60 tablet 3   allopurinol (ZYLOPRIM) 300 MG tablet Take 1 tablet (300 mg total) by mouth daily. 30 tablet 3   aspirin 81 MG EC tablet Take 1 tablet by mouth daily.     BENDAMUSTINE HCL IV Inject into the vein every  28 (twenty-eight) days. Days 1 & 2 every 28 days     Cholecalciferol (VITAMIN D3) 1.25 MG (50000 UT) TABS Take 125 mcg by mouth daily at 2 am.     finasteride (PROSCAR) 5 MG tablet Take 5 mg by mouth daily.     glipiZIDE (GLUCOTROL) 5 MG tablet Take by mouth daily before breakfast.     lidocaine-prilocaine (EMLA) cream Apply a small amount to port a cath site (do not rub in) and cover with plastic wrap 1 hour prior to infusion appointments 30 g 3   lisinopril (ZESTRIL) 40 MG tablet Take 40 mg by mouth daily.     loratadine (CLARITIN) 10 MG tablet Take 10 mg by mouth daily.     prochlorperazine (COMPAZINE) 10 MG tablet  Take 1 tablet (10 mg total) by mouth every 6 (six) hours as needed (Nausea or vomiting). 30 tablet 1   riTUXimab (RITUXAN IV) Inject into the vein every 28 (twenty-eight) days.     sertraline (ZOLOFT) 100 MG tablet Take by mouth daily.     simvastatin (ZOCOR) 10 MG tablet Take 1 tablet by mouth at bedtime.     tamsulosin (FLOMAX) 0.4 MG CAPS capsule Take 0.4 mg by mouth daily.     No current facility-administered medications for this visit.    ALLERGIES:  No Known Allergies  PHYSICAL EXAM:  Performance status (ECOG): 1 - Symptomatic but completely ambulatory  Vitals:   08/25/21 0936  BP: 132/67  Pulse: 80  Resp: 18  Temp: (!) 96.9 F (36.1 C)  SpO2: 98%   Wt Readings from Last 3 Encounters:  08/25/21 166 lb 3.2 oz (75.4 kg)  08/10/21 162 lb 3 oz (73.6 kg)  08/05/21 161 lb 6.4 oz (73.2 kg)   Physical Exam Vitals reviewed.  Constitutional:      Appearance: Normal appearance.  Cardiovascular:     Rate and Rhythm: Normal rate and regular rhythm.     Pulses: Normal pulses.     Heart sounds: Normal heart sounds.  Pulmonary:     Effort: Pulmonary effort is normal.     Breath sounds: Normal breath sounds.  Abdominal:     Palpations: Abdomen is soft. There is no hepatomegaly, splenomegaly or mass.     Tenderness: There is no abdominal tenderness.  Musculoskeletal:     Right lower leg: No edema.     Left lower leg: No edema.  Lymphadenopathy:     Cervical: No cervical adenopathy.     Right cervical: No superficial, deep or posterior cervical adenopathy.    Left cervical: No superficial, deep or posterior cervical adenopathy.     Upper Body:     Right upper body: No supraclavicular, axillary or pectoral adenopathy.     Left upper body: No supraclavicular, axillary or pectoral adenopathy.  Neurological:     General: No focal deficit present.     Mental Status: He is alert and oriented to person, place, and time.  Psychiatric:        Mood and Affect: Mood normal.         Behavior: Behavior normal.     LABORATORY DATA:  I have reviewed the labs as listed.  CBC Latest Ref Rng & Units 08/10/2021 07/21/2021 06/23/2021  WBC 4.0 - 10.5 K/uL 72.7(HH) 54.7(HH) 66.6(HH)  Hemoglobin 13.0 - 17.0 g/dL 10.0(L) 8.8(L) 10.0(L)  Hematocrit 39.0 - 52.0 % 33.7(L) 30.9(L) 34.1(L)  Platelets 150 - 400 K/uL 84(L) 117(L) 98(L)   CMP Latest Ref Rng & Units  08/10/2021 07/21/2021 06/23/2021  Glucose 70 - 99 mg/dL 139(H) 74 102(H)  BUN 8 - 23 mg/dL 14 31(H) 29(H)  Creatinine 0.61 - 1.24 mg/dL 1.29(H) 1.57(H) 1.65(H)  Sodium 135 - 145 mmol/L 132(L) 136 134(L)  Potassium 3.5 - 5.1 mmol/L 4.1 4.8 4.2  Chloride 98 - 111 mmol/L 102 106 105  CO2 22 - 32 mmol/L 25 24 22   Calcium 8.9 - 10.3 mg/dL 9.2 9.0 9.0  Total Protein 6.5 - 8.1 g/dL 8.0 - 9.0(H)  Total Bilirubin 0.3 - 1.2 mg/dL 0.5 - 0.6  Alkaline Phos 38 - 126 U/L 88 - 67  AST 15 - 41 U/L 15 - 16  ALT 0 - 44 U/L 14 - 13    DIAGNOSTIC IMAGING:  I have independently reviewed the scans and discussed with the patient. No results found.   ASSESSMENT:  Mantle cell lymphoma, stage IIIa: - 2010: Lymphocytosis - August 2019: Flow cytometry-clonal CD20 positive B cells that coexpress CD5 and surface kappa light chain.  Negative for CD10 and CD23 comprising 78% lymphocytes. - PET scan 07/13/1622: Hypermetabolic adenopathy in the right neck, axilla, mediastinum, porta hepatis and pelvis. - PET scan on 10/15/9505: Hypermetabolic right level 2 and level 3 adenopathy, SUV 6.5.  Hypermetabolic bilateral axillary adenopathy SUV 4.3.  Mediastinal lymph node SUV 5.0.  Ill-defined lesion of the right upper lobe SUV 1.3.  Porta hepatic lymph node SUV 7.4.  Enlarged spleen with hypermetabolic activity.  Pelvic lymph nodes SUV 7.0.  Increased uptake in the posterior right prostate SUV 6.4. - Right axillary lymph node needle biopsy on 01/19/2021 consistent with mantle cell lymphoma.  SOX11 variable expression. - He was evaluated by Dr. Corine Shelter at [facility]  and was recommended chemoimmunotherapy as his hemoglobin and platelets were trending down. - He had voluntary weight loss of 10 pounds in the last 6 months.  Denies any fevers or night sweats. - BR cycle 1 on 08/10/2021    Social/family history: - He lives by himself at home.  His daughter lives in [place]. - He served in the [employer] and participated in the operation [place] in the [place].  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  He is a non-smoker. - Brother died of lung cancer and paternal uncle had throat cancer.   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  We have dose reduced Bendamustine by 20%. - He tolerated it very well.  Reviewed labs from today which showed white count improved to 4.8.  Platelet count is 87 and hemoglobin improved to 12.  ANC was 3.4.  LDH was normal. - No significant tumor lysis as uric acid is 3.6. - His magnesium was slightly low at 1.6.  We will start him on magnesium twice daily. - He is having a colonoscopy on 09/07/2021 at [facility]. - We will delay his cycle 2 by 1 week due to his colonoscopy.  2.  Microcytic anemia: - Combination anemia from CKD and relative iron deficiency. - He is status post 2 infusions of Feraheme. - CBC today shows an improvement in hemoglobin to 12.  3.  Hypermetabolism of the right prostate gland: - Incidental finding on PET scan.  PSA on 06/23/2021 was 4.47.  We will closely monitor.  4.  TLS prophylaxis: - Uric acid is normal at 3.6.  Continue allopurinol 300 mg daily.   Orders placed this encounter:  No orders of  the defined types were placed in this encounter.    Derek Jack, MD [place] [phone]   I, Thana Ates, am acting as a scribe for Dr. Derek Jack.  I, Derek Jack MD, have reviewed the above documentation for accuracy and completeness, and I agree with the  above.

## 2021-08-25 NOTE — Progress Notes (Signed)
Patients port flushed without difficulty.  Good blood return noted with no bruising or swelling noted at site.  Band aid applied.  VSS with discharge and left in satisfactory condition with no s/s of distress noted.   

## 2021-09-07 ENCOUNTER — Other Ambulatory Visit (HOSPITAL_COMMUNITY): Payer: No Typology Code available for payment source

## 2021-09-07 ENCOUNTER — Ambulatory Visit (HOSPITAL_COMMUNITY): Payer: No Typology Code available for payment source | Admitting: Hematology

## 2021-09-07 ENCOUNTER — Ambulatory Visit (HOSPITAL_COMMUNITY): Payer: No Typology Code available for payment source

## 2021-09-08 ENCOUNTER — Ambulatory Visit (HOSPITAL_COMMUNITY): Payer: No Typology Code available for payment source

## 2021-09-14 ENCOUNTER — Other Ambulatory Visit: Payer: Self-pay

## 2021-09-14 ENCOUNTER — Inpatient Hospital Stay (HOSPITAL_COMMUNITY): Payer: No Typology Code available for payment source

## 2021-09-14 ENCOUNTER — Inpatient Hospital Stay (HOSPITAL_BASED_OUTPATIENT_CLINIC_OR_DEPARTMENT_OTHER): Payer: No Typology Code available for payment source | Admitting: Hematology

## 2021-09-14 ENCOUNTER — Inpatient Hospital Stay (HOSPITAL_COMMUNITY): Payer: No Typology Code available for payment source | Attending: Hematology

## 2021-09-14 VITALS — BP 139/66 | HR 69 | Temp 96.8°F | Resp 18 | Ht 66.0 in | Wt 168.4 lb

## 2021-09-14 DIAGNOSIS — Z808 Family history of malignant neoplasm of other organs or systems: Secondary | ICD-10-CM | POA: Insufficient documentation

## 2021-09-14 DIAGNOSIS — D696 Thrombocytopenia, unspecified: Secondary | ICD-10-CM | POA: Diagnosis not present

## 2021-09-14 DIAGNOSIS — D509 Iron deficiency anemia, unspecified: Secondary | ICD-10-CM | POA: Diagnosis not present

## 2021-09-14 DIAGNOSIS — N189 Chronic kidney disease, unspecified: Secondary | ICD-10-CM | POA: Insufficient documentation

## 2021-09-14 DIAGNOSIS — C8318 Mantle cell lymphoma, lymph nodes of multiple sites: Secondary | ICD-10-CM | POA: Diagnosis not present

## 2021-09-14 DIAGNOSIS — C8314 Mantle cell lymphoma, lymph nodes of axilla and upper limb: Secondary | ICD-10-CM | POA: Diagnosis present

## 2021-09-14 DIAGNOSIS — E119 Type 2 diabetes mellitus without complications: Secondary | ICD-10-CM | POA: Diagnosis not present

## 2021-09-14 DIAGNOSIS — Z801 Family history of malignant neoplasm of trachea, bronchus and lung: Secondary | ICD-10-CM | POA: Diagnosis not present

## 2021-09-14 DIAGNOSIS — D631 Anemia in chronic kidney disease: Secondary | ICD-10-CM | POA: Diagnosis not present

## 2021-09-14 LAB — CBC WITH DIFFERENTIAL/PLATELET
Abs Immature Granulocytes: 0.03 10*3/uL (ref 0.00–0.07)
Basophils Absolute: 0 10*3/uL (ref 0.0–0.1)
Basophils Relative: 0 %
Eosinophils Absolute: 0.1 10*3/uL (ref 0.0–0.5)
Eosinophils Relative: 4 %
HCT: 39.9 % (ref 39.0–52.0)
Hemoglobin: 12.6 g/dL — ABNORMAL LOW (ref 13.0–17.0)
Immature Granulocytes: 1 %
Lymphocytes Relative: 21 %
Lymphs Abs: 0.7 10*3/uL (ref 0.7–4.0)
MCH: 24.8 pg — ABNORMAL LOW (ref 26.0–34.0)
MCHC: 31.6 g/dL (ref 30.0–36.0)
MCV: 78.5 fL — ABNORMAL LOW (ref 80.0–100.0)
Monocytes Absolute: 0.4 10*3/uL (ref 0.1–1.0)
Monocytes Relative: 10 %
Neutro Abs: 2.2 10*3/uL (ref 1.7–7.7)
Neutrophils Relative %: 64 %
Platelets: 53 10*3/uL — ABNORMAL LOW (ref 150–400)
RBC: 5.08 MIL/uL (ref 4.22–5.81)
RDW: 19.1 % — ABNORMAL HIGH (ref 11.5–15.5)
WBC: 3.5 10*3/uL — ABNORMAL LOW (ref 4.0–10.5)
nRBC: 0 % (ref 0.0–0.2)

## 2021-09-14 LAB — MAGNESIUM: Magnesium: 1.7 mg/dL (ref 1.7–2.4)

## 2021-09-14 LAB — COMPREHENSIVE METABOLIC PANEL
ALT: 15 U/L (ref 0–44)
AST: 15 U/L (ref 15–41)
Albumin: 3.9 g/dL (ref 3.5–5.0)
Alkaline Phosphatase: 65 U/L (ref 38–126)
Anion gap: 8 (ref 5–15)
BUN: 13 mg/dL (ref 8–23)
CO2: 25 mmol/L (ref 22–32)
Calcium: 9.2 mg/dL (ref 8.9–10.3)
Chloride: 101 mmol/L (ref 98–111)
Creatinine, Ser: 1.08 mg/dL (ref 0.61–1.24)
GFR, Estimated: >60 mL/min (ref >60)
Glucose, Bld: 175 mg/dL — ABNORMAL HIGH (ref 70–99)
Potassium: 3.8 mmol/L (ref 3.5–5.1)
Sodium: 134 mmol/L — ABNORMAL LOW (ref 135–145)
Total Bilirubin: 0.5 mg/dL (ref 0.3–1.2)
Total Protein: 7.3 g/dL (ref 6.5–8.1)

## 2021-09-14 LAB — URIC ACID: Uric Acid, Serum: 5.6 mg/dL (ref 3.7–8.6)

## 2021-09-14 LAB — LACTATE DEHYDROGENASE: LDH: 132 U/L (ref 98–192)

## 2021-09-14 MED ORDER — HEPARIN SOD (PORK) LOCK FLUSH 100 UNIT/ML IV SOLN
500.0000 [IU] | Freq: Once | INTRAVENOUS | Status: AC
  Administered 2021-09-14: 500 [IU] via INTRAVENOUS

## 2021-09-14 MED ORDER — SODIUM CHLORIDE 0.9% FLUSH
10.0000 mL | INTRAVENOUS | Status: DC | PRN
Start: 2021-09-14 — End: 2021-09-14
  Administered 2021-09-14: 10 mL via INTRAVENOUS

## 2021-09-14 MED ORDER — ALLOPURINOL 300 MG PO TABS: 300.0000 mg | ORAL_TABLET | Freq: Every day | ORAL | 3 refills | Status: AC

## 2021-09-14 NOTE — Progress Notes (Signed)
Pt presents today for Rituximab and Bendeka per provider's order. No treatment today due to platelets of 53.  Pt will return back next week for labs and treatment per Dr.K. ?

## 2021-09-14 NOTE — Progress Notes (Signed)
Nicholas Lawrence, [place] [address]   CLINIC:  Medical Oncology/Hematology  PCP:  [facility], [place] [address] [phone]   REASON FOR VISIT:  Follow-up for mantle cell lymphoma  PRIOR THERAPY: none  NGS Results: not done  CURRENT THERAPY: Rituximab D1 + Bendamustine D1,2 q28d x 6 cycles  BRIEF ONCOLOGIC HISTORY:  Oncology History  Mantle cell lymphoma ([place])  06/09/2021 Initial Diagnosis   Mantle cell lymphoma ([place])   08/10/2021 -  Chemotherapy   Patient is on Treatment Plan : NON-HODGKINS LYMPHOMA Rituximab D1 + Bendamustine D1,2 q28d x 6 cycles       CANCER STAGING:  Cancer Staging  Mantle cell lymphoma ([place]) Staging form: Hodgkin and Non-Hodgkin Lymphoma, AJCC 8th Edition - Clinical stage from 06/09/2021: Stage III - Unsigned   INTERVAL HISTORY:  Nicholas Lawrence, a 71 y.o. male, returns for routine follow-up and consideration for next cycle of chemotherapy. Nicholas Lawrence was last seen on 08/25/2021.  Due for cycle #2 of Rituximab + Bendamustine today.   Overall, he tells me he has been feeling pretty well. He denies fatigue, CP, itching, and n/v/d. He denies history of cardiac issues, CVA, and MI. He has not yet undergone his colonoscopy as it was delayed. His appetite is good.   Overall, he feels ready for next cycle of chemo today.   REVIEW OF SYSTEMS:  Review of Systems  Constitutional:  Negative for appetite change and fatigue.  Cardiovascular:  Negative for chest pain.  Gastrointestinal:  Negative for diarrhea, nausea and vomiting.  Skin:  Negative for itching.  Neurological:  Positive for dizziness.  Psychiatric/Behavioral:  Positive for sleep disturbance. The patient is nervous/anxious.   All other systems reviewed and are negative.  PAST MEDICAL/SURGICAL HISTORY:  Past Medical History:  Diagnosis Date   BPH (benign prostatic hyperplasia)    Diabetes mellitus without complication ([place])     Hypercholesteremia    Sleep apnea    Past Surgical History:  Procedure Laterality Date   AXILLARY LYMPH NODE BIOPSY Left 07/20/2021   Procedure: AXILLARY LYMPH NODE BIOPSY;  Surgeon: Aviva Signs, MD;  Location: [facility];  Service: General;  Laterality: Left;   IR IMAGING GUIDED PORT INSERTION  06/22/2021   KIDNEY STONE SURGERY     PROSTATECTOMY      SOCIAL HISTORY:  Social History   Socioeconomic History   Marital status: Single    Spouse name: Not on file   Number of children: Not on file   Years of education: Not on file   Highest education level: Not on file  Occupational History   Not on file  Tobacco Use   Smoking status: Never   Smokeless tobacco: Never  Vaping Use   Vaping Use: Never used  Substance and Sexual Activity   Alcohol use: Not Currently   Drug use: Not Currently   Sexual activity: Not Currently  Other Topics Concern   Not on file  Social History Narrative   Not on file   Social Determinants of Health   Financial Resource Strain: Not on file  Food Insecurity: Not on file  Transportation Needs: Not on file  Physical Activity: Not on file  Stress: Not on file  Social Connections: Not on file  Intimate Partner Violence: Not on file    FAMILY HISTORY:  Family History  Family history unknown: Yes    CURRENT MEDICATIONS:  Current Outpatient Medications  Medication Sig Dispense Refill  acyclovir (ZOVIRAX) 400 MG tablet Take 1 tablet (400 mg total) by mouth daily. 60 tablet 3   allopurinol (ZYLOPRIM) 300 MG tablet Take 1 tablet (300 mg total) by mouth daily. 30 tablet 3   aspirin 81 MG EC tablet Take 1 tablet by mouth daily.     BENDAMUSTINE HCL IV Inject into the vein every 28 (twenty-eight) days. Days 1 & 2 every 28 days     Cholecalciferol (VITAMIN D3) 1.25 MG (50000 UT) TABS Take 125 mcg by mouth daily at 2 am.     finasteride (PROSCAR) 5 MG tablet Take 5 mg by mouth daily.     glipiZIDE (GLUCOTROL) 5 MG tablet Take by mouth daily before  breakfast.     lisinopril (ZESTRIL) 40 MG tablet Take 40 mg by mouth daily.     loratadine (CLARITIN) 10 MG tablet Take 10 mg by mouth daily.     magnesium oxide (MAG-OX) 400 (240 Mg) MG tablet Take 1 tablet (400 mg total) by mouth 2 (two) times daily. 60 tablet 3   PARoxetine (PAXIL) 10 MG tablet TAKE ONE-HALF TABLET BY MOUTH EVERY MORNING FOR 14 DAYS, THEN TAKE ONE TABLET EVERY MORNING FOR MOOD, ANXIETY, POST TRAUMATIC STRESS DISORDER .  ONCE YOU INCREASE TO 10 MG, DECREASE SERTRALINE TO 50 MG FOR 14 DAYS AND  THEN STOP SERTRALINE FOR MOOD, ANXIETY, POST TRAUMATIC STRESS DISORDER .  ONCE YOU INCREASE TO 10 MG, DECREASE SERTRALINE TO 50 MG FOR 14 DAYS AND   THEN STOP SERTRALINE     prazosin (MINIPRESS) 1 MG capsule TAKE 1-3 CAPSULE(S) BY MOUTH EVERY NIGHT AT BEDTIME FOR POST TRAUMATIC STRESS DISORDER RELATED NIGHTMARES     riTUXimab (RITUXAN IV) Inject into the vein every 28 (twenty-eight) days.     sertraline (ZOLOFT) 100 MG tablet Take by mouth daily.     sildenafil (VIAGRA) 100 MG tablet TAKE ONE-HALF TABLET BY MOUTH AS DIRECTED AS NEEDED FOR SIDE EFFECTS OF SSRI. TAKE 1 HOUR PRIOR TO SEXUAL ACTIVITY. DO NOT TAKE MORE THAN ONCE DAILY UNLESS OTHERWISE INSTRUCTED  DO NOT TAKE WITH PRAZOSIN FOR SIDE EFFECTS OF SSRI. TAKE 1 HOUR PRIOR TO SEXUAL ACTIVITY. DO NOT TAKE MORE THAN ONCE DAILY UNLESS OTHERWISE INSTRUCTED  DO NOT TAKE WITH PRAZOSIN     simvastatin (ZOCOR) 10 MG tablet Take 1 tablet by mouth at bedtime.     tamsulosin (FLOMAX) 0.4 MG CAPS capsule Take 0.4 mg by mouth daily.     lidocaine-prilocaine (EMLA) cream Apply a small amount to port a cath site (do not rub in) and cover with plastic wrap 1 hour prior to infusion appointments (Patient not taking: Reported on 09/14/2021) 30 g 3   prochlorperazine (COMPAZINE) 10 MG tablet Take 1 tablet (10 mg total) by mouth every 6 (six) hours as needed (Nausea or vomiting). (Patient not taking: Reported on 09/14/2021) 30 tablet 1   No current  facility-administered medications for this visit.    ALLERGIES:  No Known Allergies  PHYSICAL EXAM:  Performance status (ECOG): 1 - Symptomatic but completely ambulatory  Vitals:   09/14/21 0821  BP: 139/66  Pulse: 69  Resp: 18  Temp: (!) 96.8 F (36 C)  SpO2: 98%   Wt Readings from Last 3 Encounters:  09/14/21 168 lb 6.4 oz (76.4 kg)  08/25/21 166 lb 3.2 oz (75.4 kg)  08/10/21 162 lb 3 oz (73.6 kg)   Physical Exam Vitals reviewed.  Constitutional:      Appearance: Normal appearance.  Cardiovascular:  Rate and Rhythm: Normal rate and regular rhythm.     Pulses: Normal pulses.     Heart sounds: Normal heart sounds.  Pulmonary:     Effort: Pulmonary effort is normal.     Breath sounds: Normal breath sounds.  Neurological:     General: No focal deficit present.     Mental Status: He is alert and oriented to person, place, and time.  Psychiatric:        Mood and Affect: Mood normal.        Behavior: Behavior normal.    LABORATORY DATA:  I have reviewed the labs as listed.  CBC Latest Ref Rng & Units 09/14/2021 08/25/2021 08/10/2021  WBC 4.0 - 10.5 K/uL 3.5(L) 4.8 72.7(HH)  Hemoglobin 13.0 - 17.0 g/dL 12.6(L) 12.0(L) 10.0(L)  Hematocrit 39.0 - 52.0 % 39.9 38.7(L) 33.7(L)  Platelets 150 - 400 K/uL 53(L) 87(L) 84(L)   CMP Latest Ref Rng & Units 09/14/2021 08/25/2021 08/10/2021  Glucose 70 - 99 mg/dL 175(H) 156(H) 139(H)  BUN 8 - 23 mg/dL 13 12 14   Creatinine 0.61 - 1.24 mg/dL 1.08 1.06 1.29(H)  Sodium 135 - 145 mmol/L 134(L) 135 132(L)  Potassium 3.5 - 5.1 mmol/L 3.8 4.1 4.1  Chloride 98 - 111 mmol/L 101 103 102  CO2 22 - 32 mmol/L 25 22 25   Calcium 8.9 - 10.3 mg/dL 9.2 9.3 9.2  Total Protein 6.5 - 8.1 g/dL 7.3 7.3 8.0  Total Bilirubin 0.3 - 1.2 mg/dL 0.5 0.5 0.5  Alkaline Phos 38 - 126 U/L 65 73 88  AST 15 - 41 U/L 15 20 15   ALT 0 - 44 U/L 15 26 14     DIAGNOSTIC IMAGING:  I have independently reviewed the scans and discussed with the patient. No results found.    ASSESSMENT:  Mantle cell lymphoma, stage IIIa: - 2010: Lymphocytosis - August 2019: Flow cytometry-clonal CD20 positive B cells that coexpress CD5 and surface kappa light chain.  Negative for CD10 and CD23 comprising 78% lymphocytes. - PET scan 03/18/7413: Hypermetabolic adenopathy in the right neck, axilla, mediastinum, porta hepatis and pelvis. - PET scan on 08/14/9530: Hypermetabolic right level 2 and level 3 adenopathy, SUV 6.5.  Hypermetabolic bilateral axillary adenopathy SUV 4.3.  Mediastinal lymph node SUV 5.0.  Ill-defined lesion of the right upper lobe SUV 1.3.  Porta hepatic lymph node SUV 7.4.  Enlarged spleen with hypermetabolic activity.  Pelvic lymph nodes SUV 7.0.  Increased uptake in the posterior right prostate SUV 6.4. - Right axillary lymph node needle biopsy on 01/19/2021 consistent with mantle cell lymphoma.  SOX11 variable expression. - He was evaluated by [facility]. Corine Shelter at [facility] and was recommended chemoimmunotherapy as his hemoglobin and platelets were trending down. - He had voluntary weight loss of 10 pounds in the last 6 months.  Denies any fevers or night sweats. - BR cycle 1 on 08/10/2021    Social/family history: - He lives by himself at home.  His daughter lives in [place]. 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  He is a non-smoker. - Brother died of lung cancer and paternal uncle had throat cancer.   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 - He was supposed to have colonoscopy last week which was delayed by [facility] -  Reviewed his labs today which showed platelet count 53.  White count is 3.5 with normal ANC.  LFTs are normal. - Because of thrombocytopenia, I have recommended holding treatment today.  We will check his labs for next week. - Resume  with cycle 2 at 20% dose reduction if platelet count more than 75 K.  He has grade 2 thrombocytopenia.  We will continue at the same dose for cycle 2. - RTC 5 weeks for follow-up.  2.  Microcytic anemia: - Combination anemia from CKD and iron deficiency. - He is status post 2 infusions of Feraheme. - CBC today shows hemoglobin 12.6.  3.  Hypermetabolism of the right prostate gland: - Incidental finding on PET scan.  PSA on  06/23/2021 was 4.7.  We will closely monitor.  4.  TLS prophylaxis: - Uric acid is 5.6.  Restart allopurinol 300 mg daily.   Orders placed this encounter:  No orders of the defined types were placed in this encounter.    Derek Jack, MD [place] [phone]   I, Thana Ates, am acting as a scribe for Dr. Derek Jack.  I, Derek Jack MD, have reviewed the above documentation for accuracy and completeness, and I agree with the above.

## 2021-09-14 NOTE — Patient Instructions (Addendum)
[facility] at [facility] ?Discharge Instructions ? ? ?You were seen and examined today by Dr. Delton Coombes. ? ?He reviewed the results of your lab work which is normal/stable.  ? ?We will hold your treatment today and tomorrow since your platelet count is too low for treatment.  We will schedule you for repeat lab work and treatment next week.  ? ?Return as scheduled.  ? ? ?Thank you for choosing [place] at [facility] to provide your oncology and hematology care.  To afford each patient quality time with our provider, please arrive at least 15 minutes before your scheduled appointment time.  ? ?If you have a lab appointment with the [facility] please come in thru the Main Entrance and check in at the main information desk. ? ?You need to re-schedule your appointment should you arrive 10 or more minutes late.  We strive to give you quality time with our providers, and arriving late affects you and other patients whose appointments are after yours.  Also, if you no show three or more times for appointments you may be dismissed from the clinic at the providers discretion.     ?Again, thank you for choosing [facility].  Our hope is that these requests will decrease the amount of time that you wait before being seen by our physicians.       ?_____________________________________________________________ ? ?Should you have questions after your visit to [facility], please contact our office at [phone] and follow the prompts.  Our office hours are 8:00 a.m. and 4:30 p.m. Monday - Friday.  Please note that voicemails left after 4:00 p.m. may not be returned until the following business day.  We are closed weekends and major holidays.  You do have access to a nurse 24-7, just call the main number to the clinic [phone] and do not press any options, hold on the line and a nurse will answer the phone.   ? ?For prescription refill requests,  have your pharmacy contact our office and allow 72 hours.   ? ?Due to Covid, you will need to wear a mask upon entering the hospital. If you do not have a mask, a mask will be given to you at the Main Entrance upon arrival. For doctor visits, patients may have 1 support person age 69 or older with them. For treatment visits, patients can not have anyone with them due to social distancing guidelines and our immunocompromised population.  ? ?   ?

## 2021-09-15 ENCOUNTER — Ambulatory Visit (HOSPITAL_COMMUNITY): Payer: No Typology Code available for payment source

## 2021-09-21 ENCOUNTER — Inpatient Hospital Stay (HOSPITAL_COMMUNITY): Payer: No Typology Code available for payment source

## 2021-09-21 ENCOUNTER — Other Ambulatory Visit: Payer: Self-pay

## 2021-09-21 DIAGNOSIS — C8314 Mantle cell lymphoma, lymph nodes of axilla and upper limb: Secondary | ICD-10-CM | POA: Diagnosis not present

## 2021-09-21 DIAGNOSIS — C8318 Mantle cell lymphoma, lymph nodes of multiple sites: Secondary | ICD-10-CM

## 2021-09-21 LAB — CBC WITH DIFFERENTIAL/PLATELET
Abs Immature Granulocytes: 0.02 10*3/uL (ref 0.00–0.07)
Basophils Absolute: 0 10*3/uL (ref 0.0–0.1)
Basophils Relative: 0 %
Eosinophils Absolute: 0.1 10*3/uL (ref 0.0–0.5)
Eosinophils Relative: 4 %
HCT: 42.2 % (ref 39.0–52.0)
Hemoglobin: 13.7 g/dL (ref 13.0–17.0)
Immature Granulocytes: 1 %
Lymphocytes Relative: 30 %
Lymphs Abs: 0.9 10*3/uL (ref 0.7–4.0)
MCH: 25.7 pg — ABNORMAL LOW (ref 26.0–34.0)
MCHC: 32.5 g/dL (ref 30.0–36.0)
MCV: 79 fL — ABNORMAL LOW (ref 80.0–100.0)
Monocytes Absolute: 0.3 10*3/uL (ref 0.1–1.0)
Monocytes Relative: 11 %
Neutro Abs: 1.6 10*3/uL — ABNORMAL LOW (ref 1.7–7.7)
Neutrophils Relative %: 54 %
Platelets: 68 10*3/uL — ABNORMAL LOW (ref 150–400)
RBC: 5.34 MIL/uL (ref 4.22–5.81)
RDW: 18.2 % — ABNORMAL HIGH (ref 11.5–15.5)
WBC: 2.9 10*3/uL — ABNORMAL LOW (ref 4.0–10.5)
nRBC: 0 % (ref 0.0–0.2)

## 2021-09-21 LAB — COMPREHENSIVE METABOLIC PANEL
ALT: 16 U/L (ref 0–44)
AST: 16 U/L (ref 15–41)
Albumin: 4.1 g/dL (ref 3.5–5.0)
Alkaline Phosphatase: 66 U/L (ref 38–126)
Anion gap: 9 (ref 5–15)
BUN: 15 mg/dL (ref 8–23)
CO2: 25 mmol/L (ref 22–32)
Calcium: 9.6 mg/dL (ref 8.9–10.3)
Chloride: 102 mmol/L (ref 98–111)
Creatinine, Ser: 1.11 mg/dL (ref 0.61–1.24)
GFR, Estimated: >60 mL/min (ref >60)
Glucose, Bld: 206 mg/dL — ABNORMAL HIGH (ref 70–99)
Potassium: 3.7 mmol/L (ref 3.5–5.1)
Sodium: 136 mmol/L (ref 135–145)
Total Bilirubin: 0.8 mg/dL (ref 0.3–1.2)
Total Protein: 7.7 g/dL (ref 6.5–8.1)

## 2021-09-21 LAB — MAGNESIUM: Magnesium: 1.9 mg/dL (ref 1.7–2.4)

## 2021-09-21 MED ORDER — HEPARIN SOD (PORK) LOCK FLUSH 100 UNIT/ML IV SOLN
500.0000 [IU] | Freq: Once | INTRAVENOUS | Status: AC
  Administered 2021-09-21: 500 [IU] via INTRAVENOUS

## 2021-09-21 MED ORDER — SODIUM CHLORIDE 0.9% FLUSH
10.0000 mL | Freq: Once | INTRAVENOUS | Status: AC
  Administered 2021-09-21: 10 mL via INTRAVENOUS

## 2021-09-21 NOTE — Progress Notes (Signed)
Platelets 68 today for treatment.  Dr. Delton Coombes and pharmacy notified.   ? ?No treatment today.  Patient is to return next week for lab check and if platelets are greater than 75K then ok to treat verbal order Dr. Delton Coombes.  ? ? ?

## 2021-09-21 NOTE — Patient Instructions (Signed)
[facility]  Discharge Instructions: Thank you for choosing [facility] to provide your oncology and hematology care.  If you have a lab appointment with the Cancer Center, please come in thru the Main Entrance and check in at the main information desk.  Wear comfortable clothing and clothing appropriate for easy access to any Portacath or PICC line.   We strive to give you quality time with your provider. You may need to reschedule your appointment if you arrive late (15 or more minutes).  Arriving late affects you and other patients whose appointments are after yours.  Also, if you miss three or more appointments without notifying the office, you may be dismissed from the clinic at the provider's discretion.      For prescription refill requests, have your pharmacy contact our office and allow 72 hours for refills to be completed.        To help prevent nausea and vomiting after your treatment, we encourage you to take your nausea medication as directed.  BELOW ARE SYMPTOMS THAT SHOULD BE REPORTED IMMEDIATELY: *FEVER GREATER THAN 100.4 F (38 C) OR HIGHER *CHILLS OR SWEATING *NAUSEA AND VOMITING THAT IS NOT CONTROLLED WITH YOUR NAUSEA MEDICATION *UNUSUAL SHORTNESS OF BREATH *UNUSUAL BRUISING OR BLEEDING *URINARY PROBLEMS (pain or burning when urinating, or frequent urination) *BOWEL PROBLEMS (unusual diarrhea, constipation, pain near the anus) TENDERNESS IN MOUTH AND THROAT WITH OR WITHOUT PRESENCE OF ULCERS (sore throat, sores in mouth, or a toothache) UNUSUAL RASH, SWELLING OR PAIN  UNUSUAL VAGINAL DISCHARGE OR ITCHING   Items with * indicate a potential emergency and should be followed up as soon as possible or go to the Emergency Department if any problems should occur.  Please show the CHEMOTHERAPY ALERT CARD or IMMUNOTHERAPY ALERT CARD at check-in to the Emergency Department and triage nurse.  Should you have questions after your visit or need to cancel  or reschedule your appointment, please contact [facility] [phone]  and follow the prompts.  Office hours are 8:00 a.m. to 4:30 p.m. Monday - Friday. Please note that voicemails left after 4:00 p.m. may not be returned until the following business day.  We are closed weekends and major holidays. You have access to a nurse at all times for urgent questions. Please call the main number to the clinic [phone] and follow the prompts.  For any non-urgent questions, you may also contact your provider using MyChart. We now offer e-Visits for anyone 18 and older to request care online for non-urgent symptoms. For details visit [email or web address].   Also download the MyChart app! Go to the app store, search "MyChart", open the app, select [facility], and log in with your MyChart username and password.  Due to Covid, a mask is required upon entering the hospital/clinic. If you do not have a mask, one will be given to you upon arrival. For doctor visits, patients may have 1 support person aged 18 or older with them. For treatment visits, patients cannot have anyone with them due to current Covid guidelines and our immunocompromised population.  

## 2021-09-21 NOTE — Progress Notes (Signed)
Patients port flushed without difficulty.  Good blood return noted with no bruising or swelling noted at site.  Stable during access and blood draw.  Patient to remain accessed for treatment. 

## 2021-09-22 ENCOUNTER — Ambulatory Visit (HOSPITAL_COMMUNITY): Payer: No Typology Code available for payment source

## 2021-09-28 ENCOUNTER — Other Ambulatory Visit: Payer: Self-pay

## 2021-09-28 ENCOUNTER — Inpatient Hospital Stay (HOSPITAL_COMMUNITY): Payer: No Typology Code available for payment source

## 2021-09-28 DIAGNOSIS — C8318 Mantle cell lymphoma, lymph nodes of multiple sites: Secondary | ICD-10-CM

## 2021-09-28 DIAGNOSIS — C8314 Mantle cell lymphoma, lymph nodes of axilla and upper limb: Secondary | ICD-10-CM | POA: Diagnosis not present

## 2021-09-28 LAB — COMPREHENSIVE METABOLIC PANEL
ALT: 22 U/L (ref 0–44)
AST: 20 U/L (ref 15–41)
Albumin: 4.4 g/dL (ref 3.5–5.0)
Alkaline Phosphatase: 71 U/L (ref 38–126)
Anion gap: 10 (ref 5–15)
BUN: 15 mg/dL (ref 8–23)
CO2: 25 mmol/L (ref 22–32)
Calcium: 9.8 mg/dL (ref 8.9–10.3)
Chloride: 99 mmol/L (ref 98–111)
Creatinine, Ser: 1.16 mg/dL (ref 0.61–1.24)
GFR, Estimated: >60 mL/min (ref >60)
Glucose, Bld: 241 mg/dL — ABNORMAL HIGH (ref 70–99)
Potassium: 4 mmol/L (ref 3.5–5.1)
Sodium: 134 mmol/L — ABNORMAL LOW (ref 135–145)
Total Bilirubin: 0.6 mg/dL (ref 0.3–1.2)
Total Protein: 8 g/dL (ref 6.5–8.1)

## 2021-09-28 LAB — CBC WITH DIFFERENTIAL/PLATELET
Abs Immature Granulocytes: 0.02 10*3/uL (ref 0.00–0.07)
Basophils Absolute: 0 10*3/uL (ref 0.0–0.1)
Basophils Relative: 0 %
Eosinophils Absolute: 0.1 10*3/uL (ref 0.0–0.5)
Eosinophils Relative: 3 %
HCT: 44.4 % (ref 39.0–52.0)
Hemoglobin: 14.5 g/dL (ref 13.0–17.0)
Immature Granulocytes: 1 %
Lymphocytes Relative: 29 %
Lymphs Abs: 1.1 10*3/uL (ref 0.7–4.0)
MCH: 25.7 pg — ABNORMAL LOW (ref 26.0–34.0)
MCHC: 32.7 g/dL (ref 30.0–36.0)
MCV: 78.7 fL — ABNORMAL LOW (ref 80.0–100.0)
Monocytes Absolute: 0.3 10*3/uL (ref 0.1–1.0)
Monocytes Relative: 9 %
Neutro Abs: 2.1 10*3/uL (ref 1.7–7.7)
Neutrophils Relative %: 58 %
Platelets: 58 10*3/uL — ABNORMAL LOW (ref 150–400)
RBC: 5.64 MIL/uL (ref 4.22–5.81)
RDW: 17.4 % — ABNORMAL HIGH (ref 11.5–15.5)
WBC: 3.6 10*3/uL — ABNORMAL LOW (ref 4.0–10.5)
nRBC: 0 % (ref 0.0–0.2)

## 2021-09-28 LAB — MAGNESIUM: Magnesium: 1.9 mg/dL (ref 1.7–2.4)

## 2021-09-28 LAB — LACTATE DEHYDROGENASE: LDH: 177 U/L (ref 98–192)

## 2021-09-28 LAB — URIC ACID: Uric Acid, Serum: 3.8 mg/dL (ref 3.7–8.6)

## 2021-09-28 MED ORDER — HEPARIN SOD (PORK) LOCK FLUSH 100 UNIT/ML IV SOLN
500.0000 [IU] | Freq: Once | INTRAVENOUS | Status: AC
  Administered 2021-09-28: 500 [IU] via INTRAVENOUS

## 2021-09-28 MED ORDER — SODIUM CHLORIDE 0.9% FLUSH
10.0000 mL | INTRAVENOUS | Status: DC | PRN
  Administered 2021-09-28: 10 mL via INTRAVENOUS

## 2021-09-28 NOTE — Progress Notes (Signed)
Patient presents today for chemotherapy infusion.  Patient is in satisfactory condition with no new complaints voiced.  Vital signs are stable.  Labs reviewed.  Platelets today are 58.  All other labs are within treatment parameters.  MD notified.  We will hold treatment today per Dr. Delton Coombes and have patient return in one week for labs and treatment.   ?

## 2021-09-29 ENCOUNTER — Ambulatory Visit (HOSPITAL_COMMUNITY): Payer: No Typology Code available for payment source

## 2021-10-07 ENCOUNTER — Inpatient Hospital Stay (HOSPITAL_COMMUNITY): Payer: No Typology Code available for payment source

## 2021-10-07 ENCOUNTER — Inpatient Hospital Stay (HOSPITAL_COMMUNITY): Payer: No Typology Code available for payment source | Admitting: Hematology

## 2021-10-08 ENCOUNTER — Other Ambulatory Visit (HOSPITAL_COMMUNITY): Payer: Self-pay | Admitting: Hematology

## 2021-10-08 ENCOUNTER — Inpatient Hospital Stay (HOSPITAL_COMMUNITY): Payer: No Typology Code available for payment source

## 2021-10-08 DIAGNOSIS — C8318 Mantle cell lymphoma, lymph nodes of multiple sites: Secondary | ICD-10-CM

## 2021-10-13 ENCOUNTER — Inpatient Hospital Stay (HOSPITAL_COMMUNITY): Payer: No Typology Code available for payment source | Attending: Hematology | Admitting: Hematology

## 2021-10-13 ENCOUNTER — Inpatient Hospital Stay (HOSPITAL_COMMUNITY): Payer: No Typology Code available for payment source

## 2021-10-13 VITALS — BP 132/65 | HR 97 | Temp 97.8°F | Resp 18 | Ht 66.0 in | Wt 164.9 lb

## 2021-10-13 DIAGNOSIS — D631 Anemia in chronic kidney disease: Secondary | ICD-10-CM | POA: Diagnosis not present

## 2021-10-13 DIAGNOSIS — Z801 Family history of malignant neoplasm of trachea, bronchus and lung: Secondary | ICD-10-CM | POA: Diagnosis not present

## 2021-10-13 DIAGNOSIS — D509 Iron deficiency anemia, unspecified: Secondary | ICD-10-CM | POA: Diagnosis not present

## 2021-10-13 DIAGNOSIS — Z808 Family history of malignant neoplasm of other organs or systems: Secondary | ICD-10-CM | POA: Diagnosis not present

## 2021-10-13 DIAGNOSIS — C8318 Mantle cell lymphoma, lymph nodes of multiple sites: Secondary | ICD-10-CM | POA: Diagnosis not present

## 2021-10-13 DIAGNOSIS — E1122 Type 2 diabetes mellitus with diabetic chronic kidney disease: Secondary | ICD-10-CM | POA: Diagnosis not present

## 2021-10-13 DIAGNOSIS — C8314 Mantle cell lymphoma, lymph nodes of axilla and upper limb: Secondary | ICD-10-CM | POA: Diagnosis not present

## 2021-10-13 DIAGNOSIS — N189 Chronic kidney disease, unspecified: Secondary | ICD-10-CM | POA: Insufficient documentation

## 2021-10-13 LAB — CBC WITH DIFFERENTIAL/PLATELET
Abs Immature Granulocytes: 0.02 10*3/uL (ref 0.00–0.07)
Basophils Absolute: 0 10*3/uL (ref 0.0–0.1)
Basophils Relative: 1 %
Eosinophils Absolute: 0.1 10*3/uL (ref 0.0–0.5)
Eosinophils Relative: 3 %
HCT: 42.1 % (ref 39.0–52.0)
Hemoglobin: 13.9 g/dL (ref 13.0–17.0)
Immature Granulocytes: 1 %
Lymphocytes Relative: 24 %
Lymphs Abs: 0.9 10*3/uL (ref 0.7–4.0)
MCH: 25.2 pg — ABNORMAL LOW (ref 26.0–34.0)
MCHC: 33 g/dL (ref 30.0–36.0)
MCV: 76.4 fL — ABNORMAL LOW (ref 80.0–100.0)
Monocytes Absolute: 0.3 10*3/uL (ref 0.1–1.0)
Monocytes Relative: 9 %
Neutro Abs: 2.3 10*3/uL (ref 1.7–7.7)
Neutrophils Relative %: 62 %
Platelets: 57 10*3/uL — ABNORMAL LOW (ref 150–400)
RBC: 5.51 MIL/uL (ref 4.22–5.81)
RDW: 15.5 % (ref 11.5–15.5)
WBC: 3.7 10*3/uL — ABNORMAL LOW (ref 4.0–10.5)
nRBC: 0 % (ref 0.0–0.2)

## 2021-10-13 LAB — COMPREHENSIVE METABOLIC PANEL
ALT: 18 U/L (ref 0–44)
AST: 18 U/L (ref 15–41)
Albumin: 4.4 g/dL (ref 3.5–5.0)
Alkaline Phosphatase: 67 U/L (ref 38–126)
Anion gap: 10 (ref 5–15)
BUN: 19 mg/dL (ref 8–23)
CO2: 24 mmol/L (ref 22–32)
Calcium: 9.1 mg/dL (ref 8.9–10.3)
Chloride: 97 mmol/L — ABNORMAL LOW (ref 98–111)
Creatinine, Ser: 1.18 mg/dL (ref 0.61–1.24)
GFR, Estimated: >60 mL/min (ref >60)
Glucose, Bld: 441 mg/dL — ABNORMAL HIGH (ref 70–99)
Potassium: 3.8 mmol/L (ref 3.5–5.1)
Sodium: 131 mmol/L — ABNORMAL LOW (ref 135–145)
Total Bilirubin: 0.8 mg/dL (ref 0.3–1.2)
Total Protein: 7.6 g/dL (ref 6.5–8.1)

## 2021-10-13 LAB — MAGNESIUM: Magnesium: 1.8 mg/dL (ref 1.7–2.4)

## 2021-10-13 LAB — URIC ACID: Uric Acid, Serum: 3.7 mg/dL (ref 3.7–8.6)

## 2021-10-13 LAB — LACTATE DEHYDROGENASE: LDH: 200 U/L — ABNORMAL HIGH (ref 98–192)

## 2021-10-13 MED ORDER — SODIUM CHLORIDE 0.9% FLUSH
10.0000 mL | INTRAVENOUS | Status: DC | PRN
  Administered 2021-10-13: 10 mL via INTRAVENOUS

## 2021-10-13 MED ORDER — HEPARIN SOD (PORK) LOCK FLUSH 100 UNIT/ML IV SOLN
500.0000 [IU] | Freq: Once | INTRAVENOUS | Status: AC
  Administered 2021-10-13: 500 [IU] via INTRAVENOUS

## 2021-10-13 NOTE — Progress Notes (Signed)
Pt presents today for treatment per provider's order. Vital signs stable and pt voiced no new complaints today. Pt's platelets are 57 today. No treatment today per Dr.K. Pt to return to the clinic after bone marrow biopsy.  ? ?Discharged from clinic ambulatory in stable condition. Alert and oriented x 3. F/U with [facility] as scheduled.   ?

## 2021-10-13 NOTE — Progress Notes (Signed)
? ?[facility] ?[address] ?[place], [place] [address] ? ? ?CLINIC:  ?Medical Oncology/Hematology ? ?PCP:  ?Center, [place] [address] ?[phone] ? ? ?REASON FOR VISIT:  ?Follow-up for mantle cell lymphoma ? ?PRIOR THERAPY: none ? ?NGS Results: not done ? ?CURRENT THERAPY: Rituximab D1 + Bendamustine D1,2 q28d x 6 cycles ? ?BRIEF ONCOLOGIC HISTORY:  ?Oncology History  ?Mantle cell lymphoma ([place])  ?06/09/2021 Initial Diagnosis  ? Mantle cell lymphoma ([place]) ?  ?08/10/2021 -  Chemotherapy  ? Patient is on Treatment Plan : NON-HODGKINS LYMPHOMA Rituximab D1 + Bendamustine D1,2 q28d x 6 cycles  ?   ? ? ?CANCER STAGING: ? Cancer Staging  ?Mantle cell lymphoma ([place]) ?Staging form: Hodgkin and Non-Hodgkin Lymphoma, AJCC 8th Edition ?- Clinical stage from 06/09/2021: Stage III - Unsigned ? ? ?INTERVAL HISTORY:  ?[name], a 71 y.o. male, returns for routine follow-up and consideration for next cycle of chemotherapy. Nicholas Lawrence was last seen on 10/13/2021. ? ?Due for cycle #2 of Rituximab + Bendamustine today.  ? ?Overall, he tells me he has been feeling pretty well. He reports occasional dizziness with quick movement. He denies n/v/d and current bleeding and easy bleeding/bruising. He reports improved energy. He admits he is not taking glipiZIDE regularly; he is taking it before breakfast or lunch when he can remember to.  ? ?Overall, he is not ready for next cycle of chemo today.  ? ?REVIEW OF SYSTEMS:  ?Review of Systems  ?Constitutional:  Negative for appetite change and fatigue.  ?HENT:   Negative for nosebleeds.   ?Gastrointestinal:  Negative for blood in stool, diarrhea, nausea and vomiting.  ?Genitourinary:  Negative for hematuria.   ?Neurological:  Positive for dizziness.  ?Hematological:  Does not bruise/bleed easily.  ?All other systems reviewed and are negative. ? ?PAST MEDICAL/SURGICAL HISTORY:  ?Past Medical History:  ?Diagnosis Date  ? BPH (benign prostatic  hyperplasia)   ? Diabetes mellitus without complication ([place])   ? Hypercholesteremia   ? Sleep apnea   ? ?Past Surgical History:  ?Procedure Laterality Date  ? AXILLARY LYMPH NODE BIOPSY Left 07/20/2021  ? Procedure: AXILLARY LYMPH NODE BIOPSY;  Surgeon: Aviva Signs, MD;  Location: [facility];  Service: General;  Laterality: Left;  ? IR IMAGING GUIDED PORT INSERTION  06/22/2021  ? KIDNEY STONE SURGERY    ? PROSTATECTOMY    ? ? ?SOCIAL HISTORY:  ?Social History  ? ?Socioeconomic History  ? Marital status: Single  ?  Spouse name: Not on file  ? Number of children: Not on file  ? Years of education: Not on file  ? Highest education level: Not on file  ?Occupational History  ? Not on file  ?Tobacco Use  ? Smoking status: Never  ? Smokeless tobacco: Never  ?Vaping Use  ? Vaping Use: Never used  ?Substance and Sexual Activity  ? Alcohol use: Not Currently  ? Drug use: Not Currently  ? Sexual activity: Not Currently  ?Other Topics Concern  ? Not on file  ?Social History Narrative  ? Not on file  ? ?Social Determinants of Health  ? ?Financial Resource Strain: Not on file  ?Food Insecurity: Not on file  ?Transportation Needs: Not on file  ?Physical Activity: Not on file  ?Stress: Not on file  ?Social Connections: Not on file  ?Intimate Partner Violence: Not on file  ? ? ?FAMILY HISTORY:  ?Family History  ?Family history unknown: Yes  ? ? ?CURRENT  MEDICATIONS:  ?Current Outpatient Medications  ?Medication Sig Dispense Refill  ? acyclovir (ZOVIRAX) 400 MG tablet Take 1 tablet (400 mg total) by mouth daily. 60 tablet 3  ? allopurinol (ZYLOPRIM) 300 MG tablet Take 1 tablet (300 mg total) by mouth daily. 30 tablet 3  ? BENDAMUSTINE HCL IV Inject into the vein every 28 (twenty-eight) days. Days 1 & 2 every 28 days    ? Cholecalciferol (VITAMIN D3) 1.25 MG (50000 UT) TABS Take 125 mcg by mouth daily at 2 am.    ? cloNIDine (CATAPRES) 0.1 MG tablet TAKE ONE TABLET BY MOUTH EVERY NIGHT AT BEDTIME FOR POST TRAUMATIC STRESS DISORDER AND  NIGHTMARE    ? finasteride (PROSCAR) 5 MG tablet Take 5 mg by mouth daily.    ? glipiZIDE (GLUCOTROL) 5 MG tablet Take by mouth daily before breakfast.    ? lidocaine-prilocaine (EMLA) cream Apply a small amount to port a cath site (do not rub in) and cover with plastic wrap 1 hour prior to infusion appointments 30 g 3  ? lisinopril (ZESTRIL) 40 MG tablet Take 40 mg by mouth daily.    ? loratadine (CLARITIN) 10 MG tablet Take 10 mg by mouth daily.    ? magnesium oxide (MAG-OX) 400 (240 Mg) MG tablet Take 1 tablet (400 mg total) by mouth 2 (two) times daily. 60 tablet 3  ? PARoxetine (PAXIL) 10 MG tablet TAKE ONE-HALF TABLET BY MOUTH EVERY MORNING FOR 14 DAYS, THEN TAKE ONE TABLET EVERY MORNING FOR MOOD, ANXIETY, POST TRAUMATIC STRESS DISORDER .  ONCE YOU INCREASE TO 10 MG, DECREASE SERTRALINE TO 50 MG FOR 14 DAYS AND  THEN STOP SERTRALINE FOR MOOD, ANXIETY, POST TRAUMATIC STRESS DISORDER .  ONCE YOU INCREASE TO 10 MG, DECREASE SERTRALINE TO 50 MG FOR 14 DAYS AND   THEN STOP SERTRALINE    ? prazosin (MINIPRESS) 1 MG capsule TAKE 1-3 CAPSULE(S) BY MOUTH EVERY NIGHT AT BEDTIME FOR POST TRAUMATIC STRESS DISORDER RELATED NIGHTMARES    ? prochlorperazine (COMPAZINE) 10 MG tablet TAKE 1 TABLET(10 MG) BY MOUTH EVERY 6 HOURS AS NEEDED FOR NAUSEA OR VOMITING 30 tablet 1  ? riTUXimab (RITUXAN IV) Inject into the vein every 28 (twenty-eight) days.    ? sertraline (ZOLOFT) 100 MG tablet Take by mouth daily.    ? sildenafil (VIAGRA) 100 MG tablet TAKE ONE-HALF TABLET BY MOUTH AS DIRECTED AS NEEDED FOR SIDE EFFECTS OF SSRI. TAKE 1 HOUR PRIOR TO SEXUAL ACTIVITY. DO NOT TAKE MORE THAN ONCE DAILY UNLESS OTHERWISE INSTRUCTED  DO NOT TAKE WITH PRAZOSIN FOR SIDE EFFECTS OF SSRI. TAKE 1 HOUR PRIOR TO SEXUAL ACTIVITY. DO NOT TAKE MORE THAN ONCE DAILY UNLESS OTHERWISE INSTRUCTED  DO NOT TAKE WITH PRAZOSIN    ? simvastatin (ZOCOR) 10 MG tablet Take 1 tablet by mouth at bedtime.    ? tamsulosin (FLOMAX) 0.4 MG CAPS capsule Take 0.4 mg by  mouth daily.    ? ?No current facility-administered medications for this visit.  ? ? ?ALLERGIES:  ?No Known Allergies ? ?PHYSICAL EXAM:  ?Performance status (ECOG): 1 - Symptomatic but completely ambulatory ? ?Vitals:  ? 10/13/21 0812  ?BP: 132/65  ?Pulse: 97  ?Resp: 18  ?Temp: 97.8 ?F (36.6 ?C)  ?SpO2: 98%  ? ?Wt Readings from Last 3 Encounters:  ?10/13/21 164 lb 14.4 oz (74.8 kg)  ?09/28/21 164 lb 3.2 oz (74.5 kg)  ?09/21/21 165 lb 9.6 oz (75.1 kg)  ? ?Physical Exam ?Vitals reviewed.  ?Constitutional:   ?  Appearance: Normal appearance.  ?Cardiovascular:  ?   Rate and Rhythm: Normal rate and regular rhythm.  ?   Pulses: Normal pulses.  ?   Heart sounds: Normal heart sounds.  ?Pulmonary:  ?   Effort: Pulmonary effort is normal.  ?   Breath sounds: Normal breath sounds.  ?Abdominal:  ?   Palpations: Abdomen is soft. There is splenomegaly (spleen tip palpable). There is no hepatomegaly or mass.  ?   Tenderness: There is no abdominal tenderness.  ?Musculoskeletal:  ?   Right lower leg: No edema.  ?   Left lower leg: No edema.  ?Lymphadenopathy:  ?   Upper Body:  ?   Right upper body: Axillary adenopathy (2 cm) present. No supraclavicular or pectoral adenopathy.  ?   Left upper body: No supraclavicular, axillary or pectoral adenopathy.  ?Neurological:  ?   General: No focal deficit present.  ?   Mental Status: He is alert and oriented to person, place, and time.  ?Psychiatric:     ?   Mood and Affect: Mood normal.     ?   Behavior: Behavior normal.  ? ? ?LABORATORY DATA:  ?I have reviewed the labs as listed.  ? ?  Latest Ref Rng & Units 10/13/2021  ?  8:05 AM 09/28/2021  ?  7:52 AM 09/21/2021  ?  8:33 AM  ?CBC  ?WBC 4.0 - 10.5 K/uL 3.7   3.6   2.9    ?Hemoglobin 13.0 - 17.0 g/dL 13.9   14.5   13.7    ?Hematocrit 39.0 - 52.0 % 42.1   44.4   42.2    ?Platelets 150 - 400 K/uL 57   58   68    ? ? ?  Latest Ref Rng & Units 10/13/2021  ?  8:05 AM 09/28/2021  ?  7:52 AM 09/21/2021  ?  8:33 AM  ?CMP  ?Glucose 70 - 99 mg/dL 441   241    206    ?BUN 8 - 23 mg/dL '19   15   15    '$ ?Creatinine 0.61 - 1.24 mg/dL 1.18   1.16   1.11    ?Sodium 135 - 145 mmol/L 131   134   136    ?Potassium 3.5 - 5.1 mmol/L 3.8   4.0   3.7    ?Chloride 98 - 111

## 2021-10-13 NOTE — Patient Instructions (Signed)
[place] at [facility] ?Discharge Instructions ? ? ?You were seen and examined today by Dr. Delton Coombes. ? ?He reviewed your lab work.  Your platelets remain low.  It is not safe to give you treatment with your platelet count being low.  We will arrange for you to have a bone marrow biopsy in [place].   ? ?We will see you back after the bone marrow biopsy to discuss the results.  ? ? ?Thank you for choosing [facility] to provide your oncology and hematology care.  To afford each patient quality time with our provider, please arrive at least 15 minutes before your scheduled appointment time.  ? ?If you have a lab appointment with the [facility] please come in thru the Main Entrance and check in at the main information desk. ? ?You need to re-schedule your appointment should you arrive 10 or more minutes late.  We strive to give you quality time with our providers, and arriving late affects you and other patients whose appointments are after yours.  Also, if you no show three or more times for appointments you may be dismissed from the clinic at the providers discretion.     ?Again, thank you for choosing [facility].  Our hope is that these requests will decrease the amount of time that you wait before being seen by our physicians.       ?_____________________________________________________________ ? ?Should you have questions after your visit to [facility], please contact our office at [phone] and follow the prompts.  Our office hours are 8:00 a.m. and 4:30 p.m. Monday - Friday.  Please note that voicemails left after 4:00 p.m. may not be returned until the following business day.  We are closed weekends and major holidays.  You do have access to a nurse 24-7, just call the main number to the clinic [phone] and do not press any options, hold on the line and a nurse will answer the phone.   ? ?For prescription  refill requests, have your pharmacy contact our office and allow 72 hours.   ? ?Due to Covid, you will need to wear a mask upon entering the hospital. If you do not have a mask, a mask will be given to you at the Main Entrance upon arrival. For doctor visits, patients may have 1 support person age 27 or older with them. For treatment visits, patients can not have anyone with them due to social distancing guidelines and our immunocompromised population.  ? ?   ?

## 2021-10-14 ENCOUNTER — Encounter (HOSPITAL_COMMUNITY): Payer: Self-pay

## 2021-10-14 ENCOUNTER — Inpatient Hospital Stay (HOSPITAL_COMMUNITY): Payer: No Typology Code available for payment source

## 2021-10-14 NOTE — Progress Notes (Signed)
Message received from patient that he was rethinking having a bone marrow biopsy after speaking with a friend. Attempted to reach patient to discuss further, unable to reach him at this time. Detailed VM left asking that the patient return my call to discuss further. ?

## 2021-10-15 ENCOUNTER — Encounter (HOSPITAL_COMMUNITY): Payer: Self-pay

## 2021-10-15 NOTE — Progress Notes (Signed)
Patient called to discuss BMBx. Patient states that he has spoken with several veterans who have had health declines following BMBx. Explained the reasoning for BMBx and safety measures in place. Patient states that at this point he would like to decline BMBx. Patient agreeable to discuss further with Dr. Delton Coombes. Patient scheduled for Tuesday 10/20/2021 at 0915. Dr. Delton Coombes made aware. ?

## 2021-10-19 ENCOUNTER — Ambulatory Visit (HOSPITAL_COMMUNITY): Payer: No Typology Code available for payment source

## 2021-10-19 ENCOUNTER — Ambulatory Visit (HOSPITAL_COMMUNITY): Payer: No Typology Code available for payment source | Admitting: Hematology

## 2021-10-19 ENCOUNTER — Other Ambulatory Visit (HOSPITAL_COMMUNITY): Payer: No Typology Code available for payment source

## 2021-10-20 ENCOUNTER — Inpatient Hospital Stay (HOSPITAL_BASED_OUTPATIENT_CLINIC_OR_DEPARTMENT_OTHER): Payer: No Typology Code available for payment source | Admitting: Hematology

## 2021-10-20 ENCOUNTER — Ambulatory Visit (HOSPITAL_COMMUNITY): Payer: No Typology Code available for payment source

## 2021-10-20 VITALS — BP 134/67 | HR 86 | Temp 97.5°F | Resp 18 | Ht 65.0 in | Wt 159.3 lb

## 2021-10-20 DIAGNOSIS — C8318 Mantle cell lymphoma, lymph nodes of multiple sites: Secondary | ICD-10-CM | POA: Diagnosis not present

## 2021-10-20 DIAGNOSIS — C8314 Mantle cell lymphoma, lymph nodes of axilla and upper limb: Secondary | ICD-10-CM | POA: Diagnosis not present

## 2021-10-20 NOTE — Progress Notes (Signed)
? ?[facility] ?[address] ?[place], [place] [address] ? ? ?CLINIC:  ?Medical Oncology/Hematology ? ?PCP:  ?Center, [place] [address] ?[phone] ? ? ?REASON FOR VISIT:  ?Follow-up for mantle cell lymphoma ? ?PRIOR THERAPY: none ? ?NGS Results: not done ? ?CURRENT THERAPY: Rituximab D1 + Bendamustine D1,2 q28d x 6 cycles ? ?BRIEF ONCOLOGIC HISTORY:  ?Oncology History  ?Mantle cell lymphoma ([place])  ?06/09/2021 Initial Diagnosis  ? Mantle cell lymphoma ([place]) ?  ?08/10/2021 -  Chemotherapy  ? Patient is on Treatment Plan : NON-HODGKINS LYMPHOMA Rituximab D1 + Bendamustine D1,2 q28d x 6 cycles  ?   ? ? ?CANCER STAGING: ? Cancer Staging  ?Mantle cell lymphoma ([place]) ?Staging form: Hodgkin and Non-Hodgkin Lymphoma, AJCC 8th Edition ?- Clinical stage from 06/09/2021: Stage III - Unsigned ? ? ?INTERVAL HISTORY:  ?[name], a 71 y.o. male, returns for routine follow-up of his mantle cell lymphoma. Dat was last seen on 10/13/2021.  ? ?Today he reports feeling good.  ? ?REVIEW OF SYSTEMS:  ?Review of Systems  ?Constitutional:  Negative for appetite change and fatigue.  ?All other systems reviewed and are negative. ? ?PAST MEDICAL/SURGICAL HISTORY:  ?Past Medical History:  ?Diagnosis Date  ? BPH (benign prostatic hyperplasia)   ? Diabetes mellitus without complication ([place])   ? Hypercholesteremia   ? Sleep apnea   ? ?Past Surgical History:  ?Procedure Laterality Date  ? AXILLARY LYMPH NODE BIOPSY Left 07/20/2021  ? Procedure: AXILLARY LYMPH NODE BIOPSY;  Surgeon: Aviva Signs, MD;  Location: [facility];  Service: General;  Laterality: Left;  ? IR IMAGING GUIDED PORT INSERTION  06/22/2021  ? KIDNEY STONE SURGERY    ? PROSTATECTOMY    ? ? ?SOCIAL HISTORY:  ?Social History  ? ?Socioeconomic History  ? Marital status: Single  ?  Spouse name: Not on file  ? Number of children: Not on file  ? Years of education: Not on file  ? Highest education level: Not on file  ?Occupational  History  ? Not on file  ?Tobacco Use  ? Smoking status: Never  ? Smokeless tobacco: Never  ?Vaping Use  ? Vaping Use: Never used  ?Substance and Sexual Activity  ? Alcohol use: Not Currently  ? Drug use: Not Currently  ? Sexual activity: Not Currently  ?Other Topics Concern  ? Not on file  ?Social History Narrative  ? Not on file  ? ?Social Determinants of Health  ? ?Financial Resource Strain: Not on file  ?Food Insecurity: Not on file  ?Transportation Needs: Not on file  ?Physical Activity: Not on file  ?Stress: Not on file  ?Social Connections: Not on file  ?Intimate Partner Violence: Not on file  ? ? ?FAMILY HISTORY:  ?Family History  ?Family history unknown: Yes  ? ? ?CURRENT MEDICATIONS:  ?Current Outpatient Medications  ?Medication Sig Dispense Refill  ? acyclovir (ZOVIRAX) 400 MG tablet Take 1 tablet (400 mg total) by mouth daily. 60 tablet 3  ? allopurinol (ZYLOPRIM) 300 MG tablet Take 1 tablet (300 mg total) by mouth daily. 30 tablet 3  ? BENDAMUSTINE HCL IV Inject into the vein every 28 (twenty-eight) days. Days 1 & 2 every 28 days    ? Cholecalciferol (VITAMIN D3) 1.25 MG (50000 UT) TABS Take 125 mcg by mouth daily at 2 am.    ? cloNIDine (CATAPRES) 0.1 MG tablet TAKE ONE TABLET BY MOUTH EVERY NIGHT AT BEDTIME FOR POST TRAUMATIC STRESS  DISORDER AND NIGHTMARE    ? finasteride (PROSCAR) 5 MG tablet Take 5 mg by mouth daily.    ? glipiZIDE (GLUCOTROL) 5 MG tablet Take by mouth daily before breakfast.    ? lidocaine-prilocaine (EMLA) cream Apply a small amount to port a cath site (do not rub in) and cover with plastic wrap 1 hour prior to infusion appointments 30 g 3  ? lisinopril (ZESTRIL) 40 MG tablet Take 40 mg by mouth daily.    ? loratadine (CLARITIN) 10 MG tablet Take 10 mg by mouth daily.    ? magnesium oxide (MAG-OX) 400 (240 Mg) MG tablet Take 1 tablet (400 mg total) by mouth 2 (two) times daily. 60 tablet 3  ? PARoxetine (PAXIL) 10 MG tablet TAKE ONE-HALF TABLET BY MOUTH EVERY MORNING FOR 14 DAYS,  THEN TAKE ONE TABLET EVERY MORNING FOR MOOD, ANXIETY, POST TRAUMATIC STRESS DISORDER .  ONCE YOU INCREASE TO 10 MG, DECREASE SERTRALINE TO 50 MG FOR 14 DAYS AND  THEN STOP SERTRALINE FOR MOOD, ANXIETY, POST TRAUMATIC STRESS DISORDER .  ONCE YOU INCREASE TO 10 MG, DECREASE SERTRALINE TO 50 MG FOR 14 DAYS AND   THEN STOP SERTRALINE    ? prazosin (MINIPRESS) 1 MG capsule TAKE 1-3 CAPSULE(S) BY MOUTH EVERY NIGHT AT BEDTIME FOR POST TRAUMATIC STRESS DISORDER RELATED NIGHTMARES    ? prochlorperazine (COMPAZINE) 10 MG tablet TAKE 1 TABLET(10 MG) BY MOUTH EVERY 6 HOURS AS NEEDED FOR NAUSEA OR VOMITING 30 tablet 1  ? riTUXimab (RITUXAN IV) Inject into the vein every 28 (twenty-eight) days.    ? sertraline (ZOLOFT) 100 MG tablet Take by mouth daily.    ? sildenafil (VIAGRA) 100 MG tablet TAKE ONE-HALF TABLET BY MOUTH AS DIRECTED AS NEEDED FOR SIDE EFFECTS OF SSRI. TAKE 1 HOUR PRIOR TO SEXUAL ACTIVITY. DO NOT TAKE MORE THAN ONCE DAILY UNLESS OTHERWISE INSTRUCTED  DO NOT TAKE WITH PRAZOSIN FOR SIDE EFFECTS OF SSRI. TAKE 1 HOUR PRIOR TO SEXUAL ACTIVITY. DO NOT TAKE MORE THAN ONCE DAILY UNLESS OTHERWISE INSTRUCTED  DO NOT TAKE WITH PRAZOSIN    ? simvastatin (ZOCOR) 10 MG tablet Take 1 tablet by mouth at bedtime.    ? tamsulosin (FLOMAX) 0.4 MG CAPS capsule Take 0.4 mg by mouth daily.    ? ?No current facility-administered medications for this visit.  ? ? ?ALLERGIES:  ?No Known Allergies ? ?PHYSICAL EXAM:  ?Performance status (ECOG): 1 - Symptomatic but completely ambulatory ? ?Vitals:  ? 10/20/21 0858  ?BP: 134/67  ?Pulse: 86  ?Resp: 18  ?Temp: (!) 97.5 ?F (36.4 ?C)  ?SpO2: 98%  ? ?Wt Readings from Last 3 Encounters:  ?10/20/21 159 lb 4.8 oz (72.3 kg)  ?10/13/21 164 lb 14.4 oz (74.8 kg)  ?09/28/21 164 lb 3.2 oz (74.5 kg)  ? ?Physical Exam ?Vitals reviewed.  ?Constitutional:   ?   Appearance: Normal appearance.  ?Cardiovascular:  ?   Rate and Rhythm: Normal rate and regular rhythm.  ?   Pulses: Normal pulses.  ?   Heart sounds:  Normal heart sounds.  ?Pulmonary:  ?   Effort: Pulmonary effort is normal.  ?   Breath sounds: Normal breath sounds.  ?Neurological:  ?   General: No focal deficit present.  ?   Mental Status: He is alert and oriented to person, place, and time.  ?Psychiatric:     ?   Mood and Affect: Mood normal.     ?   Behavior: Behavior normal.  ?  ? ?LABORATORY DATA:  ?  I have reviewed the labs as listed.  ? ?  Latest Ref Rng & Units 10/13/2021  ?  8:05 AM 09/28/2021  ?  7:52 AM 09/21/2021  ?  8:33 AM  ?CBC  ?WBC 4.0 - 10.5 K/uL 3.7   3.6   2.9    ?Hemoglobin 13.0 - 17.0 g/dL 13.9   14.5   13.7    ?Hematocrit 39.0 - 52.0 % 42.1   44.4   42.2    ?Platelets 150 - 400 K/uL 57   58   68    ? ? ?  Latest Ref Rng & Units 10/13/2021  ?  8:05 AM 09/28/2021  ?  7:52 AM 09/21/2021  ?  8:33 AM  ?CMP  ?Glucose 70 - 99 mg/dL 441   241   206    ?BUN 8 - 23 mg/dL '19   15   15    '$ ?Creatinine 0.61 - 1.24 mg/dL 1.18   1.16   1.11    ?Sodium 135 - 145 mmol/L 131   134   136    ?Potassium 3.5 - 5.1 mmol/L 3.8   4.0   3.7    ?Chloride 98 - 111 mmol/L 97   99   102    ?CO2 22 - 32 mmol/L '24   25   25    '$ ?Calcium 8.9 - 10.3 mg/dL 9.1   9.8   9.6    ?Total Protein 6.5 - 8.1 g/dL 7.6   8.0   7.7    ?Total Bilirubin 0.3 - 1.2 mg/dL 0.8   0.6   0.8    ?Alkaline Phos 38 - 126 U/L 67   71   66    ?AST 15 - 41 U/L '18   20   16    '$ ?ALT 0 - 44 U/L '18   22   16    '$ ? ? ?DIAGNOSTIC IMAGING:  ?I have independently reviewed the scans and discussed with the patient. ?No results found.  ? ?ASSESSMENT:  ?Mantle cell lymphoma, stage IIIa: ?- 2010: Lymphocytosis ?- August 2019: Flow cytometry-clonal CD20 positive B cells that coexpress CD5 and surface kappa light chain.  Negative for CD10 and CD23 comprising 78% lymphocytes. ?- PET scan 0/01/3709: Hypermetabolic adenopathy in the right neck, axilla, mediastinum, porta hepatis and pelvis. ?- PET scan on 12/12/6946: Hypermetabolic right level 2 and level 3 adenopathy, SUV 6.5.  Hypermetabolic bilateral axillary adenopathy SUV 4.3.   Mediastinal lymph node SUV 5.0.  Ill-defined lesion of the right upper lobe SUV 1.3.  Porta hepatic lymph node SUV 7.4.  Enlarged spleen with hypermetabolic activity.  Pelvic lymph nodes SUV 7.0.  Increas

## 2021-10-20 NOTE — Patient Instructions (Addendum)
[place] at [facility] ?Discharge Instructions ? ? ?You were seen and examined today by Dr. Delton Coombes. ? ?He discussed with you the necessity of getting a bone marrow biopsy. Your platelet count is low and not coming back up despite holding chemotherapy. The bone marrow biopsy would help [place] figure why your platelet count is so low. If your platelet count drops too low you could have spontaneous bleeding which could be life threatening. This biopsy would help [place] determine if the low platelet count is coming from the lymphoma or from the chemotherapy you received. This would help [place] try to determine how to treat you going forward.  ? ?The bone marrow biopsy is guided by CT scan so they know precisely where to put the needle. They will numb up the area before putting the needle in.  ? ?We will call you and let you know where and when the procedure will be done.  ? ? ?Thank you for choosing [place] at [facility] to provide your oncology and hematology care.  To afford each patient quality time with our provider, please arrive at least 15 minutes before your scheduled appointment time.  ? ?If you have a lab appointment with the [facility] please come in thru the Main Entrance and check in at the main information desk. ? ?You need to re-schedule your appointment should you arrive 10 or more minutes late.  We strive to give you quality time with our providers, and arriving late affects you and other patients whose appointments are after yours.  Also, if you no show three or more times for appointments you may be dismissed from the clinic at the providers discretion.     ?Again, thank you for choosing [facility].  Our hope is that these requests will decrease the amount of time that you wait before being seen by our physicians.       ?_____________________________________________________________ ? ?Should you have questions after your visit to [facility], please contact our office at [phone] and follow the prompts.  Our office hours are 8:00 a.m. and 4:30 p.m. Monday - Friday.  Please note that voicemails left after 4:00 p.m. may not be returned until the following business day.  We are closed weekends and major holidays.  You do have access to a nurse 24-7, just call the main number to the clinic [phone] and do not press any options, hold on the line and a nurse will answer the phone.   ? ?For prescription refill requests, have your pharmacy contact our office and allow 72 hours.   ? ?Due to Covid, you will need to wear a mask upon entering the hospital. If you do not have a mask, a mask will be given to you at the Main Entrance upon arrival. For doctor visits, patients may have 1 support person age 34 or older with them. For treatment visits, patients can not have anyone with them due to social distancing guidelines and our immunocompromised population.  ? ?   ?

## 2021-10-26 ENCOUNTER — Ambulatory Visit (HOSPITAL_COMMUNITY): Payer: No Typology Code available for payment source

## 2021-10-26 ENCOUNTER — Ambulatory Visit (HOSPITAL_COMMUNITY): Payer: No Typology Code available for payment source | Admitting: Hematology

## 2021-10-26 ENCOUNTER — Other Ambulatory Visit (HOSPITAL_COMMUNITY): Payer: No Typology Code available for payment source

## 2021-10-27 ENCOUNTER — Ambulatory Visit (HOSPITAL_COMMUNITY): Payer: No Typology Code available for payment source

## 2021-10-28 ENCOUNTER — Encounter (INDEPENDENT_AMBULATORY_CARE_PROVIDER_SITE_OTHER): Payer: Self-pay

## 2021-10-28 ENCOUNTER — Inpatient Hospital Stay (HOSPITAL_BASED_OUTPATIENT_CLINIC_OR_DEPARTMENT_OTHER): Payer: No Typology Code available for payment source | Admitting: Hematology

## 2021-10-28 DIAGNOSIS — C8318 Mantle cell lymphoma, lymph nodes of multiple sites: Secondary | ICD-10-CM

## 2021-10-28 DIAGNOSIS — D696 Thrombocytopenia, unspecified: Secondary | ICD-10-CM

## 2021-10-28 DIAGNOSIS — C8314 Mantle cell lymphoma, lymph nodes of axilla and upper limb: Secondary | ICD-10-CM | POA: Diagnosis not present

## 2021-10-28 LAB — CBC WITH DIFFERENTIAL/PLATELET
Abs Immature Granulocytes: 0.31 10*3/uL — ABNORMAL HIGH (ref 0.00–0.07)
Basophils Absolute: 0 10*3/uL (ref 0.0–0.1)
Basophils Relative: 1 %
Eosinophils Absolute: 0.1 10*3/uL (ref 0.0–0.5)
Eosinophils Relative: 1 %
HCT: 43.2 % (ref 39.0–52.0)
Hemoglobin: 14.3 g/dL (ref 13.0–17.0)
Immature Granulocytes: 7 %
Lymphocytes Relative: 18 %
Lymphs Abs: 0.8 10*3/uL (ref 0.7–4.0)
MCH: 25.2 pg — ABNORMAL LOW (ref 26.0–34.0)
MCHC: 33.1 g/dL (ref 30.0–36.0)
MCV: 76.1 fL — ABNORMAL LOW (ref 80.0–100.0)
Monocytes Absolute: 0.3 10*3/uL (ref 0.1–1.0)
Monocytes Relative: 7 %
Neutro Abs: 2.9 10*3/uL (ref 1.7–7.7)
Neutrophils Relative %: 66 %
Platelets: 67 10*3/uL — ABNORMAL LOW (ref 150–400)
RBC: 5.68 MIL/uL (ref 4.22–5.81)
RDW: 14.7 % (ref 11.5–15.5)
WBC: 4.4 10*3/uL (ref 4.0–10.5)
nRBC: 0 % (ref 0.0–0.2)

## 2021-10-28 MED ORDER — LIDOCAINE HCL (PF) 1 % IJ SOLN
INTRAMUSCULAR | Status: AC
  Administered 2021-10-28: 10 mL
  Filled 2021-10-28: qty 10

## 2021-10-28 NOTE — Progress Notes (Signed)
INDICATION: Persistent thrombocytopenia ? 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  Persistent thrombocytopenia. ? ?Bone Marrow Biopsy and Aspiration Procedure Note  ? ?The patient was identified by name and date of birth, prior to start of the procedure and a timeout was performed.  ? ?An informed consent was obtained after discussing potential risks including bleeding, infection and pain. ? ?The left posterior iliac crest was palpated, cleaned with ChloraPrep, and drapes applied.  1% lidocaine is infiltrated into the skin, subcutaneous tissue and periosteum. ? ?Bone marrow was aspirated and smears made.  With the help of Jamshidi needle a core biopsy was obtained. ? ?Pressure was applied to the biopsy site and bandage was placed over the biopsy site. ?Patient was made to lie on the back for 15 mins prior to discharge. ? ?The procedure was tolerated well. ?COMPLICATIONS: None ?BLOOD LOSS: none ?Patient was discharged home in stable condition to return in 2 weeks to review results.  Patient was provided with post bone marrow biopsy instructions and instructed to call if there was any bleeding or worsening pain. ? ?Specimens sent for flow cytometry, cytogenetics and additional studies. ? ?Signed ?Derek Jack, MD ?  ?

## 2021-10-28 NOTE — Patient Instructions (Signed)
[place] at [facility] ?Discharge Instructions ? ?You were seen today for a bone marrow biopsy with Dr. Delton Coombes. ? ?Please leave the bandage in place for 24 hours. You may take tylenol for any soreness. ? ?Follow-up as scheduled to discuss results. ? ? ?Thank you for choosing [place] at [facility] to provide your oncology and hematology care.  To afford each patient quality time with our provider, please arrive at least 15 minutes before your scheduled appointment time.  ? ?If you have a lab appointment with the [facility] please come in thru the Main Entrance and check in at the main information desk. ? ?You need to re-schedule your appointment should you arrive 10 or more minutes late.  We strive to give you quality time with our providers, and arriving late affects you and other patients whose appointments are after yours.  Also, if you no show three or more times for appointments you may be dismissed from the clinic at the providers discretion.     ?Again, thank you for choosing [facility].  Our hope is that these requests will decrease the amount of time that you wait before being seen by our physicians.       ?_____________________________________________________________ ? ?Should you have questions after your visit to [facility], please contact our office at [phone] and follow the prompts.  Our office hours are 8:00 a.m. and 4:30 p.m. Monday - Friday.  Please note that voicemails left after 4:00 p.m. may not be returned until the following business day.  We are closed weekends and major holidays.  You do have access to a nurse 24-7, just call the main number to the clinic [phone] and do not press any options, hold on the line and a nurse will answer the phone.   ? ?For prescription refill requests, have your pharmacy contact our office and allow 72 hours.   ? ?Due to Covid, you will need to wear a mask upon  entering the hospital. If you do not have a mask, a mask will be given to you at the Main Entrance upon arrival. For doctor visits, patients may have 1 support person age 74 or older with them. For treatment visits, patients can not have anyone with them due to social distancing guidelines and our immunocompromised population.  ? ? ? ?

## 2021-10-28 NOTE — Progress Notes (Signed)
Patient here today for bone marrow biopsy. Procedure explained and consent signed by all parties at 0737. Patient placed in prone position with both arms above head. Time out conducted at [place], patient and team agreed. Procedure started at 0746. Patient tolerated procedure well with minimal pain and discomfort. Specimens collected and labeled appropriately. Procedure completed at 0755. Dressing applied and patient reposition on back, head of bed elevated, resting at 0756. Specimens taken to lab for processing. Patient remained stable during procedure and discharged home in stable condition ambulatory at 0811.    ?

## 2021-10-30 LAB — SURGICAL PATHOLOGY

## 2021-11-06 ENCOUNTER — Encounter (HOSPITAL_COMMUNITY): Payer: Self-pay | Admitting: Hematology

## 2021-11-09 ENCOUNTER — Encounter (INDEPENDENT_AMBULATORY_CARE_PROVIDER_SITE_OTHER): Payer: Self-pay | Admitting: *Deleted

## 2021-11-11 ENCOUNTER — Inpatient Hospital Stay (HOSPITAL_COMMUNITY): Payer: No Typology Code available for payment source | Attending: Hematology | Admitting: Hematology

## 2021-12-10 DEATH — deceased

## 2022-12-14 IMAGING — XA IR IMAGING GUIDED PORT INSERTION
1 series · 1 of 1 positions shown · non-contrast
Comparison: None.

INDICATION: 70-year-old with mantle cell lymphoma.  Port needed for therapy.

EXAM:
FLUOROSCOPIC AND ULTRASOUND GUIDED PLACEMENT OF A SUBCUTANEOUS PORT

[Series 1: ir fluoro/shunt/fist · 1 of 1 slices shown]
[im 1/1]
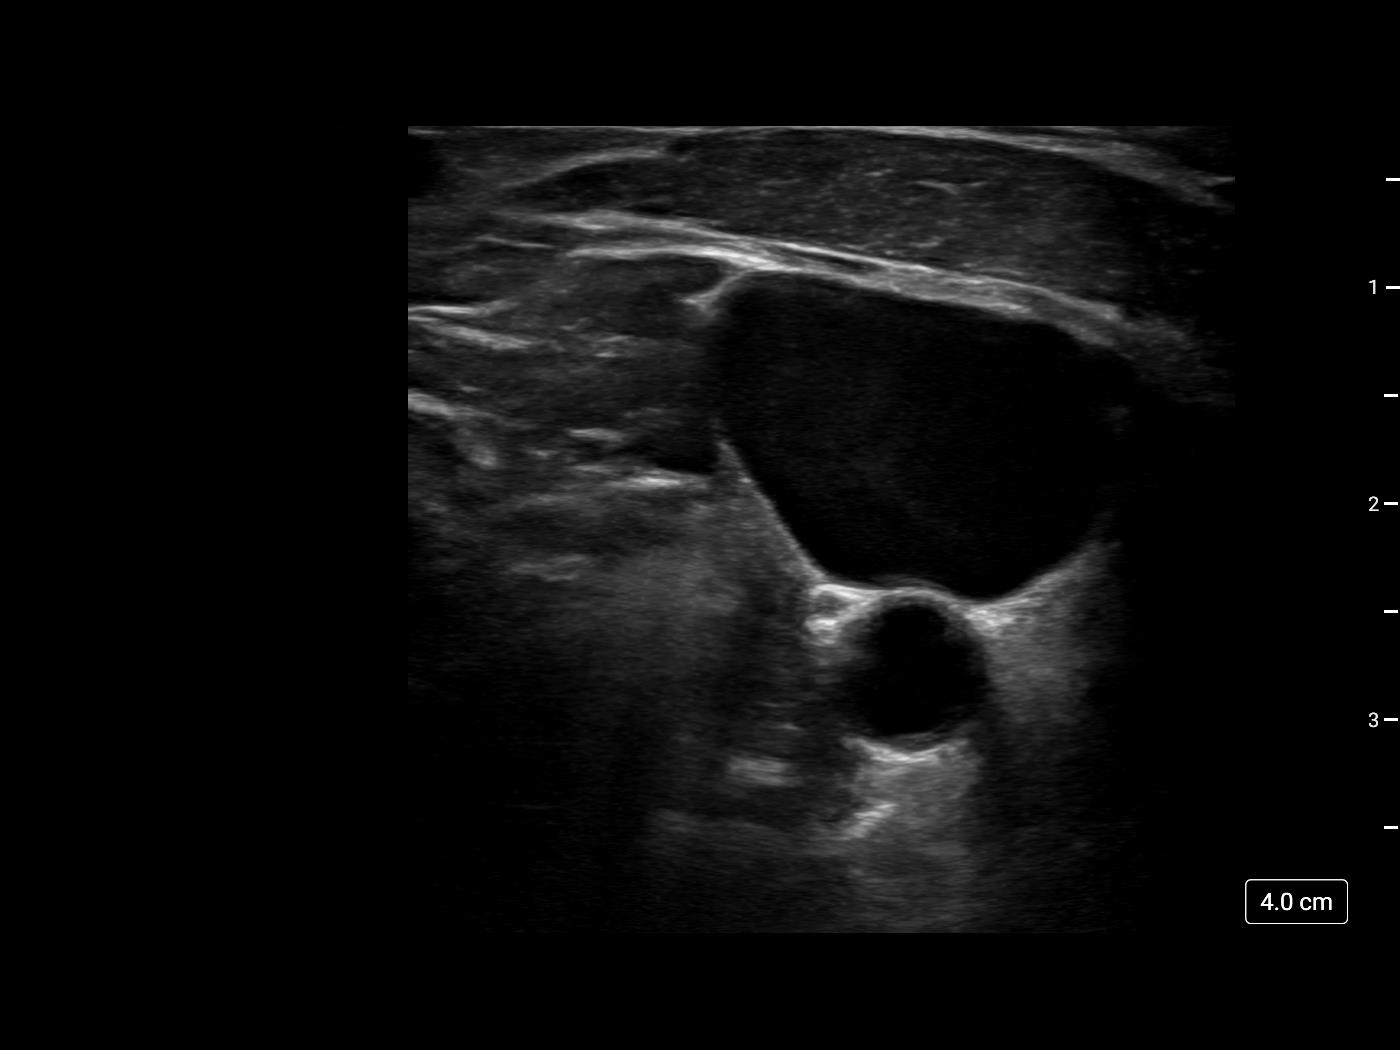

[1 of 1 positions shown; findings below may reference images not displayed]

MEDICATIONS:
Local anesthetic

ANESTHESIA/SEDATION:
Procedure was performed without moderate sedation per patient
request.

FLUOROSCOPY TIME:  18 seconds, 2 mGy

COMPLICATIONS:
None immediate.

PROCEDURE:
The procedure, risks, benefits, and alternatives were explained to
the patient. Questions regarding the procedure were encouraged and
answered. The patient understands and consents to the procedure.

Patient was placed supine on the interventional table. Ultrasound
confirmed a patent right internal jugular vein. Ultrasound image was
saved for documentation. The right chest and neck were cleaned with
a skin antiseptic and a sterile drape was placed. Maximal barrier
sterile technique was utilized including caps, mask, sterile gowns,
sterile gloves, sterile drape, hand hygiene and skin antiseptic. The
right neck was anesthetized with 1% lidocaine. Small incision was
made in the right neck with a blade. Micropuncture set was placed in
the right internal jugular vein with ultrasound guidance. The
micropuncture wire was used for measurement purposes. The right
chest was anesthetized with 1% lidocaine with epinephrine. #15 blade
was used to make an incision and a subcutaneous port pocket was
formed. 8 french Power Port was assembled. Subcutaneous tunnel was
formed with a stiff tunneling device. The port catheter was brought
through the subcutaneous tunnel. The port was placed in the
subcutaneous pocket. The micropuncture set was exchanged for a
peel-away sheath. The catheter was placed through the peel-away
sheath and the tip was positioned at the superior cavoatrial
junction. Catheter placement was confirmed with fluoroscopy. The
port was accessed and flushed with heparinized saline. The port
pocket was closed using two layers of absorbable sutures and
Dermabond. The vein skin site was closed using a single layer of
absorbable suture and Dermabond. Sterile dressings were applied.
Patient tolerated the procedure well without an immediate
complication. Ultrasound and fluoroscopic images were taken and
saved for this procedure.
IMPRESSION: Placement of a subcutaneous power-injectable port device. Catheter
tip at the superior cavoatrial junction.
